# Patient Record
Sex: Female | Born: 1980 | Hispanic: No | Marital: Married | Smoking: Never smoker
Health system: Southern US, Community
[De-identification: ages and names within clinical notes are randomized; demographics above are authoritative.]

## PROBLEM LIST (undated history)

## (undated) DIAGNOSIS — D649 Anemia, unspecified: Secondary | ICD-10-CM

## (undated) HISTORY — DX: Anemia, unspecified: D64.9

---

## 2006-04-24 DIAGNOSIS — E039 Hypothyroidism, unspecified: Secondary | ICD-10-CM

## 2006-04-24 HISTORY — DX: Hypothyroidism, unspecified: E03.9

## 2020-04-24 NOTE — L&D Delivery Note (Signed)
Patient was C/C/+1 and pushed for 10 minutes with epidural.    NSVD  female infant, Apgars 8,9, weight P.   The patient had midline second degree perineal laceration repaired with 2-0 vicryl. Fundus was firm. EBL was expected amount. Placenta was delivered intact. Vagina was clear.  Delayed cord clamping done for 30-60 seconds while warming baby. Baby was vigorous and doing skin to skin with mother.  Jessica Foley

## 2020-05-17 ENCOUNTER — Other Ambulatory Visit: Payer: Self-pay | Admitting: Hematology and Oncology

## 2020-05-17 MED ORDER — AMOXICILLIN-POT CLAVULANATE 875-125 MG PO TABS: 1.0000 | ORAL_TABLET | Freq: Two times a day (BID) | ORAL | 0 refills | Status: DC

## 2020-05-17 MED FILL — AMOX TR-K CLV 875-125 MG TA: 875-125 | 14 days supply | Qty: 28 | Fill #0

## 2020-07-12 DIAGNOSIS — Z3201 Encounter for pregnancy test, result positive: Secondary | ICD-10-CM | POA: Diagnosis not present

## 2020-07-12 DIAGNOSIS — N925 Other specified irregular menstruation: Secondary | ICD-10-CM | POA: Diagnosis not present

## 2020-07-12 DIAGNOSIS — Z124 Encounter for screening for malignant neoplasm of cervix: Secondary | ICD-10-CM | POA: Diagnosis not present

## 2020-07-12 DIAGNOSIS — Z3480 Encounter for supervision of other normal pregnancy, unspecified trimester: Secondary | ICD-10-CM | POA: Diagnosis not present

## 2020-07-12 DIAGNOSIS — Z113 Encounter for screening for infections with a predominantly sexual mode of transmission: Secondary | ICD-10-CM | POA: Diagnosis not present

## 2020-07-14 ENCOUNTER — Other Ambulatory Visit (HOSPITAL_COMMUNITY): Payer: Self-pay | Admitting: Obstetrics

## 2020-07-14 MED FILL — AMOXICILLIN 500 MG CAPSULE: 500 | 7 days supply | Qty: 21 | Fill #0

## 2020-08-04 DIAGNOSIS — Z3480 Encounter for supervision of other normal pregnancy, unspecified trimester: Secondary | ICD-10-CM | POA: Diagnosis not present

## 2020-08-04 DIAGNOSIS — Z369 Encounter for antenatal screening, unspecified: Secondary | ICD-10-CM | POA: Diagnosis not present

## 2020-08-04 DIAGNOSIS — E02 Subclinical iodine-deficiency hypothyroidism: Secondary | ICD-10-CM | POA: Diagnosis not present

## 2020-08-04 DIAGNOSIS — O09511 Supervision of elderly primigravida, first trimester: Secondary | ICD-10-CM | POA: Diagnosis not present

## 2020-08-04 LAB — OB RESULTS CONSOLE HEPATITIS B SURFACE ANTIGEN: Hepatitis B Surface Ag: NEGATIVE

## 2020-08-04 LAB — OB RESULTS CONSOLE GC/CHLAMYDIA
Chlamydia: NEGATIVE
Gonorrhea: NEGATIVE

## 2020-08-04 LAB — OB RESULTS CONSOLE HIV ANTIBODY (ROUTINE TESTING): HIV: NONREACTIVE

## 2020-08-04 LAB — OB RESULTS CONSOLE ABO/RH: RH Type: POSITIVE

## 2020-08-04 LAB — OB RESULTS CONSOLE RPR: RPR: NONREACTIVE

## 2020-08-04 LAB — OB RESULTS CONSOLE ANTIBODY SCREEN: Antibody Screen: NEGATIVE

## 2020-08-04 LAB — OB RESULTS CONSOLE RUBELLA ANTIBODY, IGM: Rubella: IMMUNE

## 2020-08-24 ENCOUNTER — Other Ambulatory Visit: Payer: Self-pay | Admitting: Obstetrics

## 2020-08-24 DIAGNOSIS — Z363 Encounter for antenatal screening for malformations: Secondary | ICD-10-CM

## 2020-08-24 DIAGNOSIS — O09522 Supervision of elderly multigravida, second trimester: Secondary | ICD-10-CM

## 2020-08-24 DIAGNOSIS — Z3A19 19 weeks gestation of pregnancy: Secondary | ICD-10-CM

## 2020-08-27 ENCOUNTER — Other Ambulatory Visit: Payer: Self-pay

## 2020-09-01 DIAGNOSIS — Z369 Encounter for antenatal screening, unspecified: Secondary | ICD-10-CM | POA: Diagnosis not present

## 2020-09-13 ENCOUNTER — Ambulatory Visit (INDEPENDENT_AMBULATORY_CARE_PROVIDER_SITE_OTHER): Payer: No Typology Code available for payment source | Admitting: Allergy

## 2020-09-13 ENCOUNTER — Other Ambulatory Visit: Payer: Self-pay

## 2020-09-13 ENCOUNTER — Encounter: Payer: Self-pay | Admitting: Allergy

## 2020-09-13 ENCOUNTER — Other Ambulatory Visit (HOSPITAL_COMMUNITY): Payer: Self-pay

## 2020-09-13 VITALS — BP 98/58 | HR 78 | Temp 98.7°F | Resp 20 | Ht 62.0 in | Wt 143.2 lb

## 2020-09-13 DIAGNOSIS — H1013 Acute atopic conjunctivitis, bilateral: Secondary | ICD-10-CM | POA: Diagnosis not present

## 2020-09-13 DIAGNOSIS — Z3A18 18 weeks gestation of pregnancy: Secondary | ICD-10-CM

## 2020-09-13 DIAGNOSIS — J3089 Other allergic rhinitis: Secondary | ICD-10-CM | POA: Diagnosis not present

## 2020-09-13 MED ORDER — AZELASTINE HCL 0.1 % NA SOLN
1.0000 | Freq: Two times a day (BID) | NASAL | 5 refills | Status: DC | PRN
  Filled 2020-09-13: qty 30, 30d supply, fill #0
  Filled 2020-09-27: qty 30, 34d supply, fill #0

## 2020-09-13 MED ORDER — BUDESONIDE 32 MCG/ACT NA SUSP
1.0000 | Freq: Two times a day (BID) | NASAL | 5 refills | Status: DC
  Filled 2020-09-13 – 2020-09-27 (×2): qty 8.43, 30d supply, fill #0

## 2020-09-13 NOTE — Assessment & Plan Note (Signed)
.   See assessment and plan as above. . Declines eyedrops. 

## 2020-09-13 NOTE — Progress Notes (Signed)
New Patient Note  RE: Jessica Foley MRN: [id number] DOB: 17-Jan-1981 Date of Office Visit: 09/13/2020  Consult requested by: No ref. provider found Primary care provider: Pcp, No  Chief Complaint: Allergic Rhinitis  (New to [place] and had congestion, post nasal drip, sleepless nights. Zyrtec, afrin, Flonase, sudafed. )  History of Present Illness: I had the pleasure of seeing Jessica Foley for initial evaluation at the [facility] on 09/13/2020. She is a 40 y.o. female, who is self-referred here for the evaluation of allergic rhinitis.  She reports symptoms of nasal congestion, PND, rhinorrhea, sore throat, sneezing, itchy/watery eyes. Symptoms have been going on for a few months since moving to [place] 8 months ago. Noticed it in March and slowly improving now. Some fall time symptoms in [place]. Other triggers include exposure to outdoors. Anosmia: no. Headache: sometimes. She has used sudafed, zyrtec, afrin, Flonase with minimal improvement in symptoms. Sinus infections: one. Previous work up includes: none. Previous ENT evaluation: no. Previous sinus imaging: no. History of nasal polyps: no. Last eye exam: not recently. History of reflux: not daily.  Currently [redacted] weeks pregnant - third pregnancy, no allergy flare with prior pregnancies.   Assessment and Plan: Jessica Foley is a 40 y.o. female with: Other allergic rhinitis Rhinoconjunctivitis symptoms mainly in the spring since moved to [place] 8 months ago.  Tried Zyrtec, Sudafed, Afrin and Flonase with minimal benefit.  Currently [redacted] weeks pregnant.  No prior allergy/ENT evaluation. . Get bloodwork as below.   Use Rhinocort nasal spray 1-2 sprays per nostril twice a day as needed for nasal congestion.   Use azelastine nasal spray 1-2 sprays per nostril twice a day as needed for runny nose/drainage.  Wean off sudafed and Afrin.  Nasal saline spray (i.e., Simply Saline) or nasal saline lavage (i.e., NeilMed) is  recommended as needed and prior to medicated nasal sprays.  Use over the counter antihistamines such as Zyrtec (cetirizine), Claritin (loratadine), Allegra (fexofenadine), or Xyzal (levocetirizine) daily as needed. May switch antihistamines every few months.  See below for environmental control measures.   Consider allergy injections for long term control if above medications do not help the symptoms - handout given depending on bloodwork results. This can be started after pregnancy is complete.   Allergic conjunctivitis of both eyes  See assessment and plan as above. Declines eye drops.   Return in about 6 months (around 03/16/2021).  Meds ordered this encounter  Medications  . azelastine (ASTELIN) 0.1 % nasal spray    Sig: Place 1-2 sprays into both nostrils 2 (two) times daily as needed. Use in each nostril as directed    Dispense:  30 mL    Refill:  5  . budesonide (RHINOCORT AQUA) 32 MCG/ACT nasal spray    Sig: Place 1-2 sprays into both nostrils in the morning and at bedtime.    Dispense:  5 mL    Refill:  5    Lab Orders     Allergens w/Total IgE Area 2  Other allergy screening: Asthma: no Food allergy: no Medication allergy: no Hymenoptera allergy: no Urticaria: no Eczema:no History of recurrent infections suggestive of immunodeficency: no  Diagnostics: None.  Past Medical History: Patient Active Problem List   Diagnosis Date Noted  . Other allergic rhinitis 09/13/2020  . Allergic conjunctivitis of both eyes 09/13/2020   History reviewed. No pertinent past medical history. Past Surgical History: History reviewed. No pertinent surgical history. Medication List:  Current Outpatient Medications  Medication  Sig Dispense Refill  . azelastine (ASTELIN) 0.1 % nasal spray Place 1-2 sprays into both nostrils 2 (two) times daily as needed. Use in each nostril as directed 30 mL 5  . budesonide (RHINOCORT AQUA) 32 MCG/ACT nasal spray Place 1-2 sprays into both  nostrils in the morning and at bedtime. 5 mL 5  . cetirizine (ZYRTEC) 10 MG tablet Take 10 mg by mouth daily.    Marland Kitchen oxymetazoline (AFRIN) 0.05 % nasal spray Place 1 spray into both nostrils 2 (two) times daily.    . Prenatal Multivit-Min-Fe-FA (PRE-NATAL PO) Take by mouth.     No current facility-administered medications for this visit.   Allergies: Not on File Social History: Social History   Socioeconomic History  . Marital status: Married    Spouse name: Not on file  . Number of children: Not on file  . Years of education: Not on file  . Highest education level: Not on file  Occupational History  . Not on file  Tobacco Use  . Smoking status: Never Smoker  . Smokeless tobacco: Never Used  Substance and Sexual Activity  . Alcohol use: Never  . Drug use: Never  . Sexual activity: Yes  Other Topics Concern  . Not on file  Social History Narrative  . Not on file   Social Determinants of Health   Financial Resource Strain: Not on file  Food Insecurity: Not on file  Transportation Needs: Not on file  Physical Activity: Not on file  Stress: Not on file  Social Connections: Not on file   Lives in a house which is less than 2 years older. Smoking: denies Occupation: physician  Environmental HistorySurveyor, minerals in the house: no Carpet in the family room: no Carpet in the bedroom: yes Heating: gas Cooling: central Pet: yes 1 dog x 2 yrs  Family History: Family History  Problem Relation Age of Onset  . Allergic rhinitis Brother    Review of Systems  Constitutional: Negative for appetite change, chills, fever and unexpected weight change.  HENT: Positive for congestion, postnasal drip, rhinorrhea, sneezing and sore throat.   Eyes: Positive for itching.  Respiratory: Negative for cough, chest tightness, shortness of breath and wheezing.   Cardiovascular: Negative for chest pain.  Gastrointestinal: Negative for abdominal pain.  Genitourinary: Negative for  difficulty urinating.  Skin: Negative for rash.  Neurological: Positive for headaches.   Objective: BP (!) 98/58   Pulse 78   Temp 98.7 F (37.1 C)   Resp 20   Ht 5\' 2"  (1.575 m)   Wt 143 lb 3.2 oz (65 kg)   SpO2 99%   BMI 26.19 kg/m  Body mass index is 26.19 kg/m. Physical Exam Vitals and nursing note reviewed.  Constitutional:      Appearance: Normal appearance. She is well-developed.  HENT:     Head: Normocephalic and atraumatic.     Right Ear: Tympanic membrane and external ear normal.     Left Ear: Tympanic membrane and external ear normal.     Nose: Congestion (on right side) present.     Mouth/Throat:     Mouth: Mucous membranes are moist.     Pharynx: Oropharynx is clear.  Eyes:     Conjunctiva/sclera: Conjunctivae normal.  Cardiovascular:     Rate and Rhythm: Normal rate and regular rhythm.     Heart sounds: Normal heart sounds. No murmur heard. No friction rub. No gallop.   Pulmonary:     Effort: Pulmonary effort is normal.  Breath sounds: Normal breath sounds. No wheezing, rhonchi or rales.  Abdominal:     Palpations: Abdomen is soft.  Musculoskeletal:     Cervical back: Neck supple.  Skin:    General: Skin is warm.     Findings: No rash.  Neurological:     Mental Status: She is alert and oriented to person, place, and time.  Psychiatric:        Behavior: Behavior normal.    The plan was reviewed with the patient/family, and all questions/concerned were addressed.  It was my pleasure to see Jessica Foley today and participate in her care. Please feel free to contact me with any questions or concerns.  Sincerely,  Wyline Mood, DO Allergy & Immunology  [facility] office: [phone] [facility] office: [phone]

## 2020-09-13 NOTE — Assessment & Plan Note (Signed)
Rhinoconjunctivitis symptoms mainly in the spring since moved to [place] 8 months ago.  Tried Zyrtec, Sudafed, Afrin and Flonase with minimal benefit.  Currently [redacted] weeks pregnant.  No prior allergy/ENT evaluation. . Get bloodwork as below.   Use Rhinocort nasal spray 1-2 sprays per nostril twice a day as needed for nasal congestion.   Use azelastine nasal spray 1-2 sprays per nostril twice a day as needed for runny nose/drainage.  Wean off sudafed and Afrin.  Nasal saline spray (i.e., Simply Saline) or nasal saline lavage (i.e., NeilMed) is recommended as needed and prior to medicated nasal sprays.  Use over the counter antihistamines such as Zyrtec (cetirizine), Claritin (loratadine), Allegra (fexofenadine), or Xyzal (levocetirizine) daily as needed. May switch antihistamines every few months.  See below for environmental control measures.   Consider allergy injections for long term control if above medications do not help the symptoms - handout given depending on bloodwork results. This can be started after pregnancy is complete.

## 2020-09-13 NOTE — Patient Instructions (Addendum)
Allergies:  Marland Kitchen Get bloodwork:  o We are ordering labs, so please allow 1-2 weeks for the results to come back. o With the newly implemented Cures Act, the labs might be visible to you at the same time that they become visible to me. However, I will not address the results until all of the results are back, so please be patient.    Use Rhinocort nasal spray 1-2 sprays per nostril twice a day as needed for nasal congestion.   Use azelastine nasal spray 1-2 sprays per nostril twice a day as needed for runny nose/drainage.  Wean off sudafed and Afrin.  Nasal saline spray (i.e., Simply Saline) or nasal saline lavage (i.e., NeilMed) is recommended as needed and prior to medicated nasal sprays.  Use over the counter antihistamines such as Zyrtec (cetirizine), Claritin (loratadine), Allegra (fexofenadine), or Xyzal (levocetirizine) daily as needed. May switch antihistamines every few months.  See below for environmental control measures.    Consider allergy injections for long term control if above medications do not help the symptoms - handout given.   Follow up in 6 months or sooner if needed.   Reducing Pollen Exposure . Pollen seasons: trees (spring), grass (summer) and ragweed/weeds (fall). Marland Kitchen Keep windows closed in your home and car to lower pollen exposure.  Lilian Kapur air conditioning in the bedroom and throughout the house if possible.  . Avoid going out in dry windy days - especially early morning. . Pollen counts are highest between 5 - 10 AM and on dry, hot and windy days.  . Save outside activities for late afternoon or after a heavy rain, when pollen levels are lower.  . Avoid mowing of grass if you have grass pollen allergy. Marland Kitchen Be aware that pollen can also be transported indoors on people and pets.  . Dry your clothes in an automatic dryer rather than hanging them outside where they might collect pollen.  . Rinse hair and eyes before bedtime.  Pet Allergen  Avoidance: . Contrary to popular opinion, there are no "hypoallergenic" breeds of dogs or cats. That is because people are not allergic to an animal's hair, but to an allergen found in the animal's saliva, dander (dead skin flakes) or urine. Pet allergy symptoms typically occur within minutes. For some people, symptoms can build up and become most severe 8 to 12 hours after contact with the animal. People with severe allergies can experience reactions in public places if dander has been transported on the pet owners' clothing. Marland Kitchen Keeping an animal outdoors is only a partial solution, since homes with pets in the yard still have higher concentrations of animal allergens. . Before getting a pet, ask your allergist to determine if you are allergic to animals. If your pet is already considered part of your family, try to minimize contact and keep the pet out of the bedroom and other rooms where you spend a great deal of time. . As with dust mites, vacuum carpets often or replace carpet with a hardwood floor, tile or linoleum. . High-efficiency particulate air (HEPA) cleaners can reduce allergen levels over time. . While dander and saliva are the source of cat and dog allergens, urine is the source of allergens from rabbits, hamsters, mice and [place] pigs; so ask a non-allergic family member to clean the animal's cage. . If you have a pet allergy, talk to your allergist about the potential for allergy immunotherapy (allergy shots). This strategy can often provide long-term relief.

## 2020-09-14 ENCOUNTER — Other Ambulatory Visit (HOSPITAL_COMMUNITY): Payer: Self-pay

## 2020-09-19 LAB — ALLERGENS W/TOTAL IGE AREA 2
Alternaria Alternata IgE: <0.1 kU/L
Aspergillus Fumigatus IgE: <0.1 kU/L
Bermuda Grass IgE: 0.14 kU/L — AB
Cat Dander IgE: 0.39 kU/L — AB
Cedar, Mountain IgE: 8.72 kU/L — AB
Cladosporium Herbarum IgE: <0.1 kU/L
Cockroach, German IgE: 0.23 kU/L — AB
Common Silver Birch IgE: <0.1 kU/L
Cottonwood IgE: <0.1 kU/L
D Farinae IgE: 0.62 kU/L — AB
D Pteronyssinus IgE: 0.35 kU/L — AB
Dog Dander IgE: 1.85 kU/L — AB
Elm, American IgE: <0.1 kU/L
IgE (Immunoglobulin E), Serum: 78 IU/mL (ref 6–495)
Johnson Grass IgE: <0.1 kU/L
Maple/Box Elder IgE: <0.1 kU/L
Mouse Urine IgE: <0.1 kU/L
Oak, White IgE: <0.1 kU/L
Pecan, Hickory IgE: <0.1 kU/L
Penicillium Chrysogen IgE: <0.1 kU/L
Pigweed, Rough IgE: <0.1 kU/L
Ragweed, Short IgE: 0.13 kU/L — AB
Sheep Sorrel IgE Qn: <0.1 kU/L
Timothy Grass IgE: <0.1 kU/L
White Mulberry IgE: <0.1 kU/L

## 2020-09-22 ENCOUNTER — Other Ambulatory Visit (HOSPITAL_COMMUNITY): Payer: Self-pay

## 2020-09-27 ENCOUNTER — Other Ambulatory Visit (HOSPITAL_COMMUNITY): Payer: Self-pay

## 2020-09-27 ENCOUNTER — Encounter: Payer: Self-pay | Admitting: *Deleted

## 2020-09-29 ENCOUNTER — Ambulatory Visit: Payer: No Typology Code available for payment source | Attending: Obstetrics

## 2020-09-29 ENCOUNTER — Other Ambulatory Visit: Payer: Self-pay

## 2020-09-29 ENCOUNTER — Ambulatory Visit: Payer: No Typology Code available for payment source | Admitting: *Deleted

## 2020-09-29 VITALS — BP 115/68 | HR 84

## 2020-09-29 DIAGNOSIS — Z363 Encounter for antenatal screening for malformations: Secondary | ICD-10-CM | POA: Diagnosis not present

## 2020-09-29 DIAGNOSIS — O09522 Supervision of elderly multigravida, second trimester: Secondary | ICD-10-CM

## 2020-09-29 DIAGNOSIS — Z3A19 19 weeks gestation of pregnancy: Secondary | ICD-10-CM | POA: Insufficient documentation

## 2020-09-30 ENCOUNTER — Other Ambulatory Visit: Payer: Self-pay | Admitting: *Deleted

## 2020-09-30 DIAGNOSIS — O43192 Other malformation of placenta, second trimester: Secondary | ICD-10-CM

## 2020-10-05 ENCOUNTER — Other Ambulatory Visit (HOSPITAL_COMMUNITY): Payer: Self-pay

## 2020-10-28 ENCOUNTER — Ambulatory Visit: Payer: No Typology Code available for payment source | Attending: Obstetrics and Gynecology

## 2020-10-28 ENCOUNTER — Ambulatory Visit: Payer: No Typology Code available for payment source | Admitting: *Deleted

## 2020-10-28 ENCOUNTER — Other Ambulatory Visit: Payer: Self-pay

## 2020-10-28 ENCOUNTER — Encounter: Payer: Self-pay | Admitting: *Deleted

## 2020-10-28 ENCOUNTER — Other Ambulatory Visit: Payer: Self-pay | Admitting: Obstetrics and Gynecology

## 2020-10-28 VITALS — BP 111/53 | HR 84

## 2020-10-28 DIAGNOSIS — O43192 Other malformation of placenta, second trimester: Secondary | ICD-10-CM | POA: Insufficient documentation

## 2020-10-28 DIAGNOSIS — O09523 Supervision of elderly multigravida, third trimester: Secondary | ICD-10-CM

## 2020-10-28 DIAGNOSIS — O09522 Supervision of elderly multigravida, second trimester: Secondary | ICD-10-CM | POA: Insufficient documentation

## 2020-10-28 DIAGNOSIS — Z3A23 23 weeks gestation of pregnancy: Secondary | ICD-10-CM | POA: Diagnosis not present

## 2020-10-29 ENCOUNTER — Other Ambulatory Visit: Payer: Self-pay | Admitting: Obstetrics and Gynecology

## 2020-10-29 DIAGNOSIS — O43192 Other malformation of placenta, second trimester: Secondary | ICD-10-CM

## 2020-10-29 DIAGNOSIS — O09522 Supervision of elderly multigravida, second trimester: Secondary | ICD-10-CM

## 2020-11-26 ENCOUNTER — Ambulatory Visit: Payer: No Typology Code available for payment source | Attending: Obstetrics and Gynecology

## 2020-11-26 ENCOUNTER — Other Ambulatory Visit: Payer: Self-pay

## 2020-11-26 ENCOUNTER — Other Ambulatory Visit: Payer: Self-pay | Admitting: *Deleted

## 2020-11-26 ENCOUNTER — Ambulatory Visit: Payer: No Typology Code available for payment source | Admitting: *Deleted

## 2020-11-26 ENCOUNTER — Encounter: Payer: Self-pay | Admitting: *Deleted

## 2020-11-26 VITALS — BP 110/61 | HR 84

## 2020-11-26 DIAGNOSIS — O09522 Supervision of elderly multigravida, second trimester: Secondary | ICD-10-CM | POA: Diagnosis not present

## 2020-11-26 DIAGNOSIS — O43192 Other malformation of placenta, second trimester: Secondary | ICD-10-CM | POA: Diagnosis not present

## 2020-11-26 DIAGNOSIS — O43193 Other malformation of placenta, third trimester: Secondary | ICD-10-CM

## 2020-12-28 ENCOUNTER — Ambulatory Visit: Payer: No Typology Code available for payment source | Attending: Obstetrics and Gynecology

## 2020-12-28 ENCOUNTER — Other Ambulatory Visit: Payer: Self-pay | Admitting: *Deleted

## 2020-12-28 ENCOUNTER — Other Ambulatory Visit: Payer: Self-pay

## 2020-12-28 ENCOUNTER — Ambulatory Visit: Payer: No Typology Code available for payment source | Admitting: *Deleted

## 2020-12-28 ENCOUNTER — Encounter: Payer: Self-pay | Admitting: *Deleted

## 2020-12-28 VITALS — BP 115/59 | HR 85

## 2020-12-28 DIAGNOSIS — Z362 Encounter for other antenatal screening follow-up: Secondary | ICD-10-CM | POA: Diagnosis not present

## 2020-12-28 DIAGNOSIS — Z3A31 31 weeks gestation of pregnancy: Secondary | ICD-10-CM | POA: Diagnosis not present

## 2020-12-28 DIAGNOSIS — O43193 Other malformation of placenta, third trimester: Secondary | ICD-10-CM | POA: Insufficient documentation

## 2020-12-28 DIAGNOSIS — O09523 Supervision of elderly multigravida, third trimester: Secondary | ICD-10-CM | POA: Insufficient documentation

## 2021-01-25 ENCOUNTER — Encounter: Payer: Self-pay | Admitting: *Deleted

## 2021-01-25 ENCOUNTER — Ambulatory Visit: Payer: No Typology Code available for payment source | Attending: Obstetrics and Gynecology

## 2021-01-25 ENCOUNTER — Other Ambulatory Visit: Payer: Self-pay

## 2021-01-25 ENCOUNTER — Ambulatory Visit: Payer: No Typology Code available for payment source | Admitting: *Deleted

## 2021-01-25 VITALS — BP 108/55 | HR 82

## 2021-01-25 DIAGNOSIS — O43193 Other malformation of placenta, third trimester: Secondary | ICD-10-CM | POA: Insufficient documentation

## 2021-01-25 DIAGNOSIS — Z3A35 35 weeks gestation of pregnancy: Secondary | ICD-10-CM

## 2021-01-25 DIAGNOSIS — O09523 Supervision of elderly multigravida, third trimester: Secondary | ICD-10-CM | POA: Diagnosis present

## 2021-02-01 LAB — OB RESULTS CONSOLE GBS
GBS: NEGATIVE
GBS: NEGATIVE
GBS: NEGATIVE

## 2021-02-12 ENCOUNTER — Other Ambulatory Visit: Payer: Self-pay

## 2021-02-12 ENCOUNTER — Inpatient Hospital Stay (HOSPITAL_COMMUNITY)
Admission: AD | Admit: 2021-02-12 | Discharge: 2021-02-13 | Disposition: A | Discharge status: Home / Self Care | Payer: No Typology Code available for payment source | Attending: Obstetrics and Gynecology | Admitting: Obstetrics and Gynecology

## 2021-02-12 DIAGNOSIS — O43193 Other malformation of placenta, third trimester: Secondary | ICD-10-CM | POA: Insufficient documentation

## 2021-02-12 DIAGNOSIS — O368130 Decreased fetal movements, third trimester, not applicable or unspecified: Secondary | ICD-10-CM | POA: Insufficient documentation

## 2021-02-12 DIAGNOSIS — Z3A38 38 weeks gestation of pregnancy: Secondary | ICD-10-CM | POA: Insufficient documentation

## 2021-02-12 DIAGNOSIS — Z711 Person with feared health complaint in whom no diagnosis is made: Secondary | ICD-10-CM

## 2021-02-12 DIAGNOSIS — Z3689 Encounter for other specified antenatal screening: Secondary | ICD-10-CM

## 2021-02-12 DIAGNOSIS — O368190 Decreased fetal movements, unspecified trimester, not applicable or unspecified: Secondary | ICD-10-CM

## 2021-02-12 NOTE — MAU Note (Signed)
Pt stated she felt a lot of jerky movement form baby about 45 min ago. Movement where painful and pt is concerned because there is marginal cor incretion. Felt like the movement could have bee a seizure. Reports she is having some cts but not regular. C/o some back pain. Denies any vag bleeding or leaking.

## 2021-02-13 ENCOUNTER — Inpatient Hospital Stay (HOSPITAL_BASED_OUTPATIENT_CLINIC_OR_DEPARTMENT_OTHER): Payer: No Typology Code available for payment source

## 2021-02-13 ENCOUNTER — Inpatient Hospital Stay: Payer: No Typology Code available for payment source

## 2021-02-13 ENCOUNTER — Encounter (HOSPITAL_COMMUNITY): Payer: Self-pay | Admitting: Obstetrics and Gynecology

## 2021-02-13 DIAGNOSIS — O43123 Velamentous insertion of umbilical cord, third trimester: Secondary | ICD-10-CM | POA: Diagnosis not present

## 2021-02-13 DIAGNOSIS — Z3A38 38 weeks gestation of pregnancy: Secondary | ICD-10-CM | POA: Diagnosis not present

## 2021-02-13 DIAGNOSIS — O368130 Decreased fetal movements, third trimester, not applicable or unspecified: Secondary | ICD-10-CM

## 2021-02-13 DIAGNOSIS — O09523 Supervision of elderly multigravida, third trimester: Secondary | ICD-10-CM

## 2021-02-13 DIAGNOSIS — O43193 Other malformation of placenta, third trimester: Secondary | ICD-10-CM

## 2021-02-13 LAB — URINALYSIS, ROUTINE W REFLEX MICROSCOPIC
Bilirubin Urine: NEGATIVE
Glucose, UA: NEGATIVE mg/dL
Hgb urine dipstick: NEGATIVE
Ketones, ur: NEGATIVE mg/dL
Nitrite: NEGATIVE
Protein, ur: NEGATIVE mg/dL
Specific Gravity, Urine: 1.01 (ref 1.005–1.030)
pH: 7 (ref 5.0–8.0)

## 2021-02-13 NOTE — MAU Provider Note (Signed)
History     CSN: [id number]  Arrival date and time: 02/12/21 2337   Event Date/Time   First Provider Initiated Contact with Patient 02/13/21 0029      Chief Complaint  Patient presents with   concern for baby movement\   Jessica Foley is a 40 y.o. G3P2002 at [redacted]w[redacted]d who receives care at [facility].  She presents today for concern for baby movement.  She states at about 2145 she had rapid fetal movement.  She states movement has been intermittent since that time, but she was worried because she has a marginal cord insertion.  Patient reports some "intermittent' contractions and denies vaginal bleeding or leaking.  She denies problems during the pregnancy.  Husband is at bedside also contributing to HPI.    OB History     Gravida  3   Para  2   Term  2   Preterm      AB      Living  2      SAB      IAB      Ectopic      Multiple      Live Births  2           Past Medical History:  Diagnosis Date   Anemia     History reviewed. No pertinent surgical history.  Family History  Problem Relation Age of Onset   Allergic rhinitis Brother     Social History   Tobacco Use   Smoking status: Never   Smokeless tobacco: Never  Vaping Use   Vaping Use: Never used  Substance Use Topics   Alcohol use: Never   Drug use: Never    Allergies: No Known Allergies  Medications Prior to Admission  Medication Sig Dispense Refill Last Dose   Prenatal Multivit-Min-Fe-FA (PRE-NATAL PO) Take by mouth.   02/13/2021   amoxicillin (AMOXIL) 500 MG capsule TAKE 1 CAPSULE BY MOUTH EVERY 8 HOURS FOR 7 DAYS (Patient not taking: Reported on 09/29/2020) 21 capsule 0    amoxicillin-clavulanate (AUGMENTIN) 875-125 MG tablet TAKE 1 TABLET BY MOUTH TWICE DAILY FOR 14 DAYS. (Patient not taking: Reported on 09/29/2020) 28 tablet 0    azelastine (ASTELIN) 0.1 % nasal spray Place 1-2 sprays into both nostrils 2 (two) times daily as needed. Use in each nostril as directed  (Patient not taking: Reported on 09/29/2020) 30 mL 5    budesonide (RHINOCORT AQUA) 32 MCG/ACT nasal spray Place 1-2 sprays into both nostrils in the morning and at bedtime. (Patient not taking: Reported on 09/29/2020) 5 mL 5    cetirizine (ZYRTEC) 10 MG tablet Take 10 mg by mouth daily. (Patient not taking: Reported on 10/28/2020)      oxymetazoline (AFRIN) 0.05 % nasal spray Place 1 spray into both nostrils 2 (two) times daily. (Patient not taking: Reported on 10/28/2020)       Review of Systems  Gastrointestinal:  Positive for abdominal pain (Contractions). Negative for nausea and vomiting.  Genitourinary:  Negative for difficulty urinating, dysuria, vaginal bleeding and vaginal discharge.  Neurological:  Negative for dizziness, light-headedness and headaches.  Physical Exam   Blood pressure 119/60, pulse 88, temperature 97.8 F (36.6 C), resp. rate 18, height 5\' 2"  (1.575 m), weight 81.6 kg.  Physical Exam Vitals reviewed.  Constitutional:      Appearance: Normal appearance.  HENT:     Head: Normocephalic and atraumatic.  Eyes:     Conjunctiva/sclera: Conjunctivae normal.  Cardiovascular:  Rate and Rhythm: Normal rate.  Pulmonary:     Effort: Pulmonary effort is normal. No respiratory distress.  Abdominal:     Comments: Gravid  Musculoskeletal:        General: Normal range of motion.     Cervical back: Normal range of motion.  Skin:    General: Skin is warm and dry.  Neurological:     Mental Status: She is alert and oriented to person, place, and time.  Psychiatric:        Mood and Affect: Mood normal.        Behavior: Behavior normal.        Thought Content: Thought content normal.    Fetal Assessment 125 bpm, Mod Var, -Decels, +Accels Toco: Irregular  MAU Course  No results found for this or any previous visit (from the past 24 hour(s)). No results found.  MDM PE Labs: None EFM BPP  Assessment and Plan  40 year old G3P2002  SIUP at 38.4 weeks Cat I  FT Concern with FM  -PNR from [facility] Reviewed. -US shows marginal cord insertion. -Exam performed -Discussed reactive NST -Patient reports some discomfort with contractions. -Nurse to perform cervical check -Will send for BPP for further reassurance.   Cherre Robins MSN, CNM 02/13/2021, 12:29 AM   Reassessment (2:11 AM)  -BPP returns at 8/8. -Patient and husband without questions. -Encouraged to call primary office or return to MAU if symptoms worsen or with the onset of new symptoms. -Discharged to home in stable condition.  Cherre Robins MSN, CNM Advanced Practice Provider, [facility] [employer]

## 2021-02-15 ENCOUNTER — Encounter (HOSPITAL_COMMUNITY): Payer: Self-pay | Admitting: *Deleted

## 2021-02-15 ENCOUNTER — Telehealth (HOSPITAL_COMMUNITY): Payer: Self-pay | Admitting: *Deleted

## 2021-02-15 NOTE — Telephone Encounter (Signed)
Preadmission screen  

## 2021-02-16 ENCOUNTER — Inpatient Hospital Stay (HOSPITAL_COMMUNITY): Payer: No Typology Code available for payment source

## 2021-02-16 ENCOUNTER — Encounter (HOSPITAL_COMMUNITY): Payer: Self-pay | Admitting: Obstetrics and Gynecology

## 2021-02-16 ENCOUNTER — Other Ambulatory Visit: Payer: Self-pay | Admitting: Obstetrics and Gynecology

## 2021-02-17 LAB — SARS CORONAVIRUS 2 (TAT 6-24 HRS): SARS Coronavirus 2: NEGATIVE

## 2021-02-18 ENCOUNTER — Inpatient Hospital Stay (HOSPITAL_COMMUNITY): Payer: No Typology Code available for payment source

## 2021-02-18 ENCOUNTER — Inpatient Hospital Stay (HOSPITAL_COMMUNITY)
Admission: AD | Admit: 2021-02-18 | Discharge: 2021-02-20 | DRG: 807 | Disposition: A | Discharge status: Home / Self Care | Payer: No Typology Code available for payment source | Attending: Obstetrics and Gynecology | Admitting: Obstetrics and Gynecology

## 2021-02-18 ENCOUNTER — Encounter (HOSPITAL_COMMUNITY): Payer: Self-pay | Admitting: Obstetrics and Gynecology

## 2021-02-18 ENCOUNTER — Inpatient Hospital Stay (HOSPITAL_COMMUNITY)
Admission: AD | Admit: 2021-02-18 | Payer: No Typology Code available for payment source | Source: Home / Self Care | Admitting: Obstetrics and Gynecology

## 2021-02-18 ENCOUNTER — Inpatient Hospital Stay (HOSPITAL_COMMUNITY): Payer: No Typology Code available for payment source | Admitting: Anesthesiology

## 2021-02-18 ENCOUNTER — Other Ambulatory Visit: Payer: Self-pay

## 2021-02-18 DIAGNOSIS — Z3490 Encounter for supervision of normal pregnancy, unspecified, unspecified trimester: Secondary | ICD-10-CM

## 2021-02-18 DIAGNOSIS — O26893 Other specified pregnancy related conditions, third trimester: Principal | ICD-10-CM | POA: Diagnosis present

## 2021-02-18 DIAGNOSIS — Z3493 Encounter for supervision of normal pregnancy, unspecified, third trimester: Secondary | ICD-10-CM

## 2021-02-18 DIAGNOSIS — Z3A39 39 weeks gestation of pregnancy: Secondary | ICD-10-CM | POA: Diagnosis not present

## 2021-02-18 LAB — CBC
HCT: 38.8 % (ref 36.0–46.0)
Hemoglobin: 13.1 g/dL (ref 12.0–15.0)
MCH: 29.3 pg (ref 26.0–34.0)
MCHC: 33.8 g/dL (ref 30.0–36.0)
MCV: 86.8 fL (ref 80.0–100.0)
Platelets: 174 10*3/uL (ref 150–400)
RBC: 4.47 MIL/uL (ref 3.87–5.11)
RDW: 14.1 % (ref 11.5–15.5)
WBC: 10.7 10*3/uL — ABNORMAL HIGH (ref 4.0–10.5)
nRBC: 0 % (ref 0.0–0.2)

## 2021-02-18 LAB — TYPE AND SCREEN
ABO/RH(D): O POS
Antibody Screen: NEGATIVE

## 2021-02-18 MED ORDER — FENTANYL-BUPIVACAINE-NACL 0.5-0.125-0.9 MG/250ML-% EP SOLN
12.0000 mL/h | EPIDURAL | Status: DC | PRN
  Administered 2021-02-18: 12 mL/h via EPIDURAL
  Filled 2021-02-18: qty 250

## 2021-02-18 MED ORDER — FENTANYL-BUPIVACAINE-NACL 0.5-0.125-0.9 MG/250ML-% EP SOLN: 12.0000 mL/h | EPIDURAL | Status: DC | PRN

## 2021-02-18 MED ORDER — LIDOCAINE HCL (PF) 1 % IJ SOLN
INTRAMUSCULAR | Status: DC | PRN
  Administered 2021-02-18: 10 mL via EPIDURAL

## 2021-02-18 MED ORDER — OXYCODONE-ACETAMINOPHEN 5-325 MG PO TABS: 1.0000 | ORAL_TABLET | ORAL | Status: DC | PRN

## 2021-02-18 MED ORDER — OXYTOCIN-SODIUM CHLORIDE 30-0.9 UT/500ML-% IV SOLN
INTRAVENOUS | Status: AC
  Filled 2021-02-18: qty 500

## 2021-02-18 MED ORDER — OXYTOCIN-SODIUM CHLORIDE 30-0.9 UT/500ML-% IV SOLN
1.0000 m[IU]/min | INTRAVENOUS | Status: DC
  Administered 2021-02-18: 2 m[IU]/min via INTRAVENOUS

## 2021-02-18 MED ORDER — LIDOCAINE HCL (PF) 1 % IJ SOLN: 30.0000 mL | INTRAMUSCULAR | Status: DC | PRN

## 2021-02-18 MED ORDER — ONDANSETRON HCL 4 MG/2ML IJ SOLN
4.0000 mg | Freq: Four times a day (QID) | INTRAMUSCULAR | Status: DC | PRN
  Administered 2021-02-18: 4 mg via INTRAVENOUS
  Filled 2021-02-18: qty 2

## 2021-02-18 MED ORDER — LACTATED RINGERS IV SOLN: 500.0000 mL | INTRAVENOUS | Status: DC | PRN

## 2021-02-18 MED ORDER — LACTATED RINGERS IV SOLN: INTRAVENOUS | Status: DC

## 2021-02-18 MED ORDER — DIPHENHYDRAMINE HCL 50 MG/ML IJ SOLN: 12.5000 mg | INTRAMUSCULAR | Status: DC | PRN

## 2021-02-18 MED ORDER — OXYCODONE-ACETAMINOPHEN 5-325 MG PO TABS: 2.0000 | ORAL_TABLET | ORAL | Status: DC | PRN

## 2021-02-18 MED ORDER — TERBUTALINE SULFATE 1 MG/ML IJ SOLN: 0.2500 mg | Freq: Once | INTRAMUSCULAR | Status: DC | PRN

## 2021-02-18 MED ORDER — OXYTOCIN BOLUS FROM INFUSION
333.0000 mL | Freq: Once | INTRAVENOUS | Status: AC
  Administered 2021-02-19: 333 mL via INTRAVENOUS

## 2021-02-18 MED ORDER — LACTATED RINGERS IV SOLN
500.0000 mL | Freq: Once | INTRAVENOUS | Status: AC
  Administered 2021-02-18: 500 mL via INTRAVENOUS

## 2021-02-18 MED ORDER — MISOPROSTOL 25 MCG QUARTER TABLET
25.0000 ug | ORAL_TABLET | ORAL | Status: DC | PRN
Start: 2021-02-18 — End: 2021-02-19

## 2021-02-18 MED ORDER — EPHEDRINE 5 MG/ML INJ: 10.0000 mg | INTRAVENOUS | Status: DC | PRN

## 2021-02-18 MED ORDER — ACETAMINOPHEN 325 MG PO TABS: 650.0000 mg | ORAL_TABLET | ORAL | Status: DC | PRN

## 2021-02-18 MED ORDER — PHENYLEPHRINE 40 MCG/ML (10ML) SYRINGE FOR IV PUSH (FOR BLOOD PRESSURE SUPPORT): 80.0000 ug | PREFILLED_SYRINGE | INTRAVENOUS | Status: DC | PRN

## 2021-02-18 MED ORDER — SOD CITRATE-CITRIC ACID 500-334 MG/5ML PO SOLN
30.0000 mL | ORAL | Status: DC | PRN
Start: 2021-02-18 — End: 2021-02-19

## 2021-02-18 MED ORDER — OXYTOCIN-SODIUM CHLORIDE 30-0.9 UT/500ML-% IV SOLN
2.5000 [IU]/h | INTRAVENOUS | Status: DC
  Administered 2021-02-19: 2.5 [IU]/h via INTRAVENOUS
  Filled 2021-02-18: qty 500

## 2021-02-18 MED ORDER — PHENYLEPHRINE 40 MCG/ML (10ML) SYRINGE FOR IV PUSH (FOR BLOOD PRESSURE SUPPORT)
80.0000 ug | PREFILLED_SYRINGE | INTRAVENOUS | Status: DC | PRN
  Filled 2021-02-18: qty 10

## 2021-02-18 NOTE — H&P (Signed)
40 y.o. [redacted]w[redacted]d  G3P2002 [facility] pt comes in for IOL for AMA at term.  Otherwise has good fetal movement and no bleeding.  Past Medical History:  Diagnosis Date   Anemia    Hypothyroidism 2008   subclinical hypothyroidism during pregnancy   No past surgical history on file.  OB History  Gravida Para Term Preterm AB Living  3 2 2     2   SAB IAB Ectopic Multiple Live Births          2    # Outcome Date GA Lbr Len/2nd Weight Sex Delivery Anes PTL Lv  3 Current           2 Term 04/24/10 [redacted]w[redacted]d  3771 g M Vag-Spont     1 Term 04/24/06 [redacted]w[redacted]d  3317 g F Vag-Spont       Social History   Socioeconomic History   Marital status: Married    Spouse name: Not on file   Number of children: Not on file   Years of education: Not on file   Highest education level: Not on file  Occupational History   Not on file  Tobacco Use   Smoking status: Never   Smokeless tobacco: Never  Vaping Use   Vaping Use: Never used  Substance and Sexual Activity   Alcohol use: Never   Drug use: Never   Sexual activity: Yes  Other Topics Concern   Not on file  Social History Narrative   ** Merged History Encounter **       Social Determinants of Health   Financial Resource Strain: Not on file  Food Insecurity: Not on file  Transportation Needs: Not on file  Physical Activity: Not on file  Stress: Not on file  Social Connections: Not on file  Intimate Partner Violence: Not on file   Patient has no known allergies.    Prenatal Transfer Tool  Maternal Diabetes: No Genetic Screening: Normal Maternal Ultrasounds/Referrals: Normal Fetal Ultrasounds or other Referrals:  None Maternal Substance Abuse:  No Significant Maternal Medications:  None Significant Maternal Lab Results: Group B Strep negative  Other PNC: uncomplicated.    There were no vitals filed for this visit.  Lungs/Cor:  NAD Abdomen:  soft, gravid Ex:  no cords, erythema SVE:  3/50/-2 FHTs:  120s, good STV, NST R; Cat 1  tracing. Toco:  q occ   A/P   AMA term induction.  GBS neg.  Pit and arom.  [redacted]w[redacted]d

## 2021-02-18 NOTE — Anesthesia Procedure Notes (Addendum)
Epidural Patient location during procedure: OB Start time: 02/18/2021 5:38 PM End time: 02/18/2021 5:42 PM  Staffing Anesthesiologist: Bethena Midget, MD  Preanesthetic Checklist Completed: patient identified, IV checked, site marked, risks and benefits discussed, surgical consent, monitors and equipment checked, pre-op evaluation and timeout performed  Epidural Patient position: sitting Prep: DuraPrep and site prepped and draped Patient monitoring: continuous pulse ox and blood pressure Approach: midline Location: L4-L5 Injection technique: LOR air  Needle:  Needle type: Tuohy  Needle gauge: 17 G Needle length: 9 cm and 9 Needle insertion depth: 4 cm Catheter type: closed end flexible Catheter size: 19 Gauge Catheter at skin depth: 9 cm Test dose: negative  Assessment Events: blood not aspirated, injection not painful, no injection resistance, no paresthesia and negative IV test

## 2021-02-18 NOTE — Anesthesia Preprocedure Evaluation (Signed)
Anesthesia Evaluation  Patient identified by MRN, date of birth, ID band Patient awake    Reviewed: Allergy & Precautions, H&P , NPO status , Patient's Chart, lab work & pertinent test results, reviewed documented beta blocker date and time   Airway Mallampati: I  TM Distance: >3 FB Neck ROM: full    Dental no notable dental hx. (+) Teeth Intact, Dental Advisory Given   Pulmonary neg pulmonary ROS,    Pulmonary exam normal breath sounds clear to auscultation       Cardiovascular negative cardio ROS Normal cardiovascular exam Rhythm:regular Rate:Normal     Neuro/Psych negative neurological ROS  negative psych ROS   GI/Hepatic negative GI ROS, Neg liver ROS,   Endo/Other  Hypothyroidism   Renal/GU negative Renal ROS  negative genitourinary   Musculoskeletal   Abdominal   Peds  Hematology  (+) Blood dyscrasia, anemia ,   Anesthesia Other Findings   Reproductive/Obstetrics (+) Pregnancy                             Anesthesia Physical Anesthesia Plan  ASA: 2  Anesthesia Plan: Epidural   Post-op Pain Management:    Induction:   PONV Risk Score and Plan: 2  Airway Management Planned: Natural Airway  Additional Equipment: None  Intra-op Plan:   Post-operative Plan:   Informed Consent: I have reviewed the patients History and Physical, chart, labs and discussed the procedure including the risks, benefits and alternatives for the proposed anesthesia with the patient or authorized representative who has indicated his/her understanding and acceptance.     Dental Advisory Given  Plan Discussed with: Anesthesiologist  Anesthesia Plan Comments: (Labs checked- platelets confirmed with RN in room. Fetal heart tracing, per RN, reported to be stable enough for sitting procedure. Discussed epidural, and patient consents to the procedure:  included risk of possible headache,backache, failed  block, allergic reaction, and nerve injury. This patient was asked if she had any questions or concerns before the procedure started.)        Anesthesia Quick Evaluation

## 2021-02-19 LAB — CBC
HCT: 34 % — ABNORMAL LOW (ref 36.0–46.0)
Hemoglobin: 11.4 g/dL — ABNORMAL LOW (ref 12.0–15.0)
MCH: 29.2 pg (ref 26.0–34.0)
MCHC: 33.5 g/dL (ref 30.0–36.0)
MCV: 87.2 fL (ref 80.0–100.0)
Platelets: 152 10*3/uL (ref 150–400)
RBC: 3.9 MIL/uL (ref 3.87–5.11)
RDW: 14.2 % (ref 11.5–15.5)
WBC: 14.7 10*3/uL — ABNORMAL HIGH (ref 4.0–10.5)
nRBC: 0 % (ref 0.0–0.2)

## 2021-02-19 LAB — RPR: RPR Ser Ql: NONREACTIVE

## 2021-02-19 MED ORDER — WITCH HAZEL-GLYCERIN EX PADS: 1.0000 "application " | MEDICATED_PAD | CUTANEOUS | Status: DC | PRN

## 2021-02-19 MED ORDER — ACETAMINOPHEN 325 MG PO TABS
650.0000 mg | ORAL_TABLET | ORAL | Status: DC | PRN
  Administered 2021-02-19 – 2021-02-20 (×3): 650 mg via ORAL
  Filled 2021-02-19 (×3): qty 2

## 2021-02-19 MED ORDER — METHYLERGONOVINE MALEATE 0.2 MG PO TABS: 0.2000 mg | ORAL_TABLET | ORAL | Status: DC | PRN

## 2021-02-19 MED ORDER — OXYCODONE-ACETAMINOPHEN 5-325 MG PO TABS: 2.0000 | ORAL_TABLET | ORAL | Status: DC | PRN

## 2021-02-19 MED ORDER — SENNOSIDES-DOCUSATE SODIUM 8.6-50 MG PO TABS
2.0000 | ORAL_TABLET | Freq: Every day | ORAL | Status: DC
  Administered 2021-02-20: 2 via ORAL
  Filled 2021-02-19: qty 2

## 2021-02-19 MED ORDER — IBUPROFEN 800 MG PO TABS
800.0000 mg | ORAL_TABLET | Freq: Three times a day (TID) | ORAL | Status: DC
  Administered 2021-02-19 – 2021-02-20 (×4): 800 mg via ORAL
  Filled 2021-02-19 (×4): qty 1

## 2021-02-19 MED ORDER — METHYLERGONOVINE MALEATE 0.2 MG/ML IJ SOLN: 0.2000 mg | INTRAMUSCULAR | Status: DC | PRN

## 2021-02-19 MED ORDER — ONDANSETRON HCL 4 MG PO TABS: 4.0000 mg | ORAL_TABLET | ORAL | Status: DC | PRN

## 2021-02-19 MED ORDER — DIPHENHYDRAMINE HCL 25 MG PO CAPS: 25.0000 mg | ORAL_CAPSULE | Freq: Four times a day (QID) | ORAL | Status: DC | PRN

## 2021-02-19 MED ORDER — FERROUS SULFATE 325 (65 FE) MG PO TABS
325.0000 mg | ORAL_TABLET | Freq: Two times a day (BID) | ORAL | Status: DC
  Administered 2021-02-19 – 2021-02-20 (×3): 325 mg via ORAL
  Filled 2021-02-19 (×3): qty 1

## 2021-02-19 MED ORDER — SIMETHICONE 80 MG PO CHEW: 80.0000 mg | CHEWABLE_TABLET | ORAL | Status: DC | PRN

## 2021-02-19 MED ORDER — MEASLES, MUMPS & RUBELLA VAC IJ SOLR: 0.5000 mL | Freq: Once | INTRAMUSCULAR | Status: DC

## 2021-02-19 MED ORDER — SODIUM CHLORIDE 0.9% FLUSH: 3.0000 mL | INTRAVENOUS | Status: DC | PRN

## 2021-02-19 MED ORDER — MAGNESIUM HYDROXIDE 400 MG/5ML PO SUSP: 30.0000 mL | ORAL | Status: DC | PRN

## 2021-02-19 MED ORDER — SODIUM CHLORIDE 0.9% FLUSH: 3.0000 mL | Freq: Two times a day (BID) | INTRAVENOUS | Status: DC

## 2021-02-19 MED ORDER — DIBUCAINE (PERIANAL) 1 % EX OINT: 1.0000 "application " | TOPICAL_OINTMENT | CUTANEOUS | Status: DC | PRN

## 2021-02-19 MED ORDER — PRENATAL MULTIVITAMIN CH
1.0000 | ORAL_TABLET | Freq: Every day | ORAL | Status: DC
  Administered 2021-02-19: 1 via ORAL
  Filled 2021-02-19: qty 1

## 2021-02-19 MED ORDER — COCONUT OIL OIL: 1.0000 "application " | TOPICAL_OIL | Status: DC | PRN

## 2021-02-19 MED ORDER — TETANUS-DIPHTH-ACELL PERTUSSIS 5-2.5-18.5 LF-MCG/0.5 IM SUSY: 0.5000 mL | PREFILLED_SYRINGE | Freq: Once | INTRAMUSCULAR | Status: DC

## 2021-02-19 MED ORDER — BENZOCAINE-MENTHOL 20-0.5 % EX AERO
1.0000 "application " | INHALATION_SPRAY | CUTANEOUS | Status: DC | PRN
  Administered 2021-02-19: 1 via TOPICAL
  Filled 2021-02-19: qty 56

## 2021-02-19 MED ORDER — ZOLPIDEM TARTRATE 5 MG PO TABS: 5.0000 mg | ORAL_TABLET | Freq: Every evening | ORAL | Status: DC | PRN

## 2021-02-19 MED ORDER — SODIUM CHLORIDE 0.9 % IV SOLN: 250.0000 mL | INTRAVENOUS | Status: DC | PRN

## 2021-02-19 MED ORDER — ONDANSETRON HCL 4 MG/2ML IJ SOLN: 4.0000 mg | INTRAMUSCULAR | Status: DC | PRN

## 2021-02-19 NOTE — Plan of Care (Signed)
  Problem: Education: Goal: Knowledge of condition will improve Outcome: Progressing   Problem: Activity: Goal: Will verbalize the importance of balancing activity with adequate rest periods Outcome: Progressing Goal: Ability to tolerate increased activity will improve Outcome: Progressing   Problem: Coping: Goal: Ability to identify and utilize available resources and services will improve Outcome: Progressing   

## 2021-02-19 NOTE — Lactation Note (Signed)
This note was copied from a baby's chart. Lactation Consultation Note  Patient Name: Boy Reannon Candella [id number] Date: 02/19/2021 Reason for consult: L&D Initial assessment Age:39 hours LC entered the room, infant was cuing to breastfeed, mom latched infant on her left breast using the football hold position. Initially infant was on and off the  breast but after 5 minutes infant started sustaining latch, infant was still breastfeeding after 14 minutes when LC left the room.  Mom knows to breastfeed infant according to feeding cues, 8 to 12+ or more times within 24 hours, skin to skin. Mom knows to ask RN/LC on MBU for latch assistance if needed.  Maternal Data Has patient been taught Hand Expression?: No Does the patient have breastfeeding experience prior to this delivery?: Yes How long did the patient breastfeed?: Per mom, she breastfeed and pumped for 1 year with her 1st and 2nd child  Feeding Mother's Current Feeding Choice: Breast Milk  LATCH Score Latch: Repeated attempts needed to sustain latch, nipple held in mouth throughout feeding, stimulation needed to elicit sucking reflex. (Infant was on and off breast initally but after 5 minutes infant started sustaining the latch.)  Audible Swallowing: A few with stimulation  Type of Nipple: Everted at rest and after stimulation  Comfort (Breast/Nipple): Soft / non-tender  Hold (Positioning): Assistance needed to correctly position infant at breast and maintain latch.  LATCH Score: 7   Lactation Tools Discussed/Used    Interventions Interventions: Assisted with latch;Skin to skin;Breast compression;Adjust position;Support pillows;Position options;Expressed milk;Education  Discharge Pump: Personal WIC Program: No  Consult Status      Danelle Earthly 02/19/2021, 1:20 AM

## 2021-02-19 NOTE — Anesthesia Postprocedure Evaluation (Signed)
Anesthesia Post Note  Patient: Mahlon Gammon Gilbertson  Procedure(s) Performed: AN AD HOC LABOR EPIDURAL     Patient location during evaluation: Mother Baby Anesthesia Type: Epidural Level of consciousness: awake and alert Pain management: pain level controlled Vital Signs Assessment: post-procedure vital signs reviewed and stable Respiratory status: spontaneous breathing, nonlabored ventilation and respiratory function stable Cardiovascular status: stable Postop Assessment: no headache, no backache and epidural receding Anesthetic complications: no   No notable events documented.  Last Vitals:  Vitals:   02/19/21 0330 02/19/21 0819  BP: (!) 99/57 (!) 93/56  Pulse: 80 70  Resp: 17 20  Temp: 36.8 C 36.8 C  SpO2: 97%     Last Pain:  Vitals:   02/19/21 0330  TempSrc: Oral  PainSc: 0-No pain   Pain Goal:                   Mauricia Area

## 2021-02-19 NOTE — Progress Notes (Signed)
Post Partum Day 0 Subjective: no complaints, up ad lib, voiding, and tolerating PO  Objective: Blood pressure (!) 98/56, pulse 65, temperature 98.3 F (36.8 C), resp. rate 18, height 5\' 2"  (1.575 m), weight 81.6 kg, SpO2 97 %.  Physical Exam:  General: alert, cooperative, and appears stated age Lochia: appropriate Uterine Fundus: firm DVT Evaluation: No evidence of DVT seen on physical exam.  Recent Labs    02/18/21 1412 02/19/21 0508  HGB 13.1 11.4*  HCT 38.8 34.0*    Assessment/Plan: Plan for discharge tomorrow and Breastfeeding Declines circ   LOS: 1 day   02/21/21 02/19/2021, 12:52 PM

## 2021-02-19 NOTE — Lactation Note (Signed)
This note was copied from Jessica baby's chart. Lactation Consultation Note  Patient Name: Jessica Foley [id number] Date: 02/19/2021 Reason for consult: Initial assessment Age:38 hours  Initial visit to 16 hours old infant of Jessica P3 mother. Baby is latched upon arrival. Mother states latch is painful. LC assisted with latch and chin tug. Mother reports improvement and comfort. Mother is experienced. LC adjusted position and alignment.  Discussed normal newborn behavior and patterns, signs of good milk transfer, hunger cues, tummy size and benefits of skin to skin.   Plan: 1-Deep, comfortable latch and breastfeeding on demand or 8-12 times in 24h period. 2-Encouraged maternal rest, hydration and food intake.  3-Contact LC as needed for feeds/support/concerns/questions   All questions answered at this time. Provided Lactation services brochure and promoted [facility] booklet information.     Maternal Data Has patient been taught Hand Expression?: Yes Does the patient have breastfeeding experience prior to this delivery?: Yes How long did the patient breastfeed?: 12 months x2 (first child mostly pump and bottlefed expressed breast milk)  LATCH Score Latch: Grasps breast easily, tongue down, lips flanged, rhythmical sucking.  Audible Swallowing: Spontaneous and intermittent  Type of Nipple: Everted at rest and after stimulation  Comfort (Breast/Nipple): Soft / non-tender  Hold (Positioning): Assistance needed to correctly position infant at breast and maintain latch.  LATCH Score: 9  Interventions Interventions: Breast feeding basics reviewed;Assisted with latch;Skin to skin;Hand express;Expressed milk;Education;Adjust position;Breast compression;LC Services brochure  Discharge Pump: Personal WIC Program: No  Consult Status Consult Status: Follow-up Date: 02/20/21 Follow-up type: In-patient    Jessica Foley Jessica Foley 02/19/2021, 5:06 PM

## 2021-02-20 ENCOUNTER — Other Ambulatory Visit (HOSPITAL_COMMUNITY): Payer: Self-pay

## 2021-02-20 MED ORDER — IBUPROFEN 600 MG PO TABS
600.0000 mg | ORAL_TABLET | Freq: Four times a day (QID) | ORAL | 0 refills | Status: AC | PRN
  Filled 2021-02-20: qty 90, 23d supply, fill #0

## 2021-02-20 NOTE — Discharge Summary (Signed)
Postpartum Discharge Summary      Patient Name: Jessica Foley DOB: 1980/09/05 MRN: [id number]  Date of admission: 02/18/2021 Delivery date:02/19/2021  Delivering provider: Carrington Clamp  Date of discharge: 02/20/2021  Admitting diagnosis: Pregnant and not yet delivered [Z34.90] Intrauterine pregnancy: [redacted]w[redacted]d     Secondary diagnosis:  Active Problems:   Pregnant and not yet delivered  Additional problems: Advanced maternal age    Discharge diagnosis: Term Pregnancy Delivered                                              Post partum procedures: NA Augmentation: AROM and Pitocin Complications: None  Hospital course: Onset of Labor With Vaginal Delivery      40 y.o. yo G3P2002 at [redacted]w[redacted]d was admitted for induction of labor for advanced maternal age on 02/18/2021. Patient had an uncomplicated labor course as follows:  Membrane Rupture Time/Date: 6:42 PM ,02/18/2021   Delivery Method:Vaginal, Spontaneous  Episiotomy: None  Lacerations:  2nd degree  Patient had an uncomplicated postpartum course.  She is ambulating, tolerating a regular diet, passing flatus, and urinating well. Patient is discharged home in stable condition on 02/20/21.  Newborn Data: Birth date:02/19/2021  Birth time:12:11 AM  Gender:Female  Living status:Living  Apgars:8 ,9  Weight:3487 g   Magnesium Sulfate received: No BMZ received: No Rhophylac:N/A  Physical exam  Vitals:   02/19/21 1153 02/19/21 1753 02/19/21 2115 02/20/21 0536  BP: (!) 98/56 (!) 81/69 115/73 (!) 109/53  Pulse: 65 66 73 72  Resp: 18 20 17 16   Temp: 98.3 F (36.8 C) 98 F (36.7 C) 98 F (36.7 C) 98 F (36.7 C)  TempSrc:   Oral Oral  SpO2:   100% 99%  Weight:      Height:       General: alert, cooperative, and no distress Lochia: appropriate Uterine Fundus: firm DVT Evaluation: No evidence of DVT seen on physical exam. Labs: Lab Results  Component Value Date   WBC 14.7 (H) 02/19/2021   HGB 11.4 (L) 02/19/2021    HCT 34.0 (L) 02/19/2021   MCV 87.2 02/19/2021   PLT 152 02/19/2021   No flowsheet data found. Edinburgh Score: Edinburgh Postnatal Depression Scale Screening Tool 02/19/2021  I have been able to laugh and see the funny side of things. 0  I have looked forward with enjoyment to things. 0  I have blamed myself unnecessarily when things went wrong. 0  I have been anxious or worried for no good reason. 0  I have felt scared or panicky for no good reason. 0  Things have been getting on top of me. 0  I have been so unhappy that I have had difficulty sleeping. 0  I have felt sad or miserable. 0  I have been so unhappy that I have been crying. 0  The thought of harming myself has occurred to me. 0  Edinburgh Postnatal Depression Scale Total 0      After visit meds:  Allergies as of 02/20/2021   No Known Allergies      Medication List     TAKE these medications    ibuprofen 600 MG tablet Commonly known as: ADVIL Take 1 tablet (600 mg total) by mouth every 6 (six) hours as needed.   prenatal multivitamin Tabs tablet Take 1 tablet by mouth daily at 12 noon.  Discharge Care Instructions  (From admission, onward)           Start     Ordered   02/20/21 0000  Discharge wound care:       Comments: For a cesarean delivery: You may wash incision with soap and water.  Do not soak or submerge the incision for 2 weeks. Keep incision dry. You may need to keep a sanitary pad or panty liner between the incision and your clothing for comfort and to keep the incision dry. If you note drainage, increased pain, or increased redness of the incision, then please notify your physician.   02/20/21 1127   02/20/21 0000  If the dressing is still on your incision site when you go home, remove it on the third day after your surgery date. Remove dressing if it begins to fall off, or if it is dirty or damaged before the third day.       Comments: For a cesarean delivery    02/20/21 1127             Discharge home in stable condition Infant Feeding: Breast Infant Disposition:home with mother Discharge instruction: per After Visit Summary and Postpartum booklet. Activity: Advance as tolerated. Pelvic rest for 6 weeks.  Diet: routine diet Anticipated Birth Control: Unsure Postpartum Appointment:4 weeks Future Appointments: Future Appointments  Date Time Provider Department Center  03/22/2021  3:15 PM Ellamae Sia, DO [facility] None   Follow up Visit:  Follow-up Information     Carrington Clamp, MD Follow up.   Specialty: Obstetrics and Gynecology Contact information: [address] [place] [address] [phone]                     02/20/2021 Waynard Reeds, MD

## 2021-02-21 ENCOUNTER — Other Ambulatory Visit (HOSPITAL_COMMUNITY): Payer: Self-pay

## 2021-03-01 ENCOUNTER — Other Ambulatory Visit (HOSPITAL_COMMUNITY): Payer: Self-pay

## 2021-03-04 ENCOUNTER — Telehealth (HOSPITAL_COMMUNITY): Payer: Self-pay | Admitting: *Deleted

## 2021-03-04 NOTE — Telephone Encounter (Signed)
Left message to return nurse call.  Duffy Rhody, RN 03-04-2021 at 10:47am

## 2021-03-22 ENCOUNTER — Ambulatory Visit (INDEPENDENT_AMBULATORY_CARE_PROVIDER_SITE_OTHER): Payer: No Typology Code available for payment source | Admitting: Allergy

## 2021-03-22 ENCOUNTER — Other Ambulatory Visit: Payer: Self-pay

## 2021-03-22 ENCOUNTER — Encounter: Payer: Self-pay | Admitting: Allergy

## 2021-03-22 VITALS — BP 116/80 | HR 57 | Temp 97.8°F | Resp 16 | Ht 62.0 in | Wt 163.0 lb

## 2021-03-22 DIAGNOSIS — J3089 Other allergic rhinitis: Secondary | ICD-10-CM | POA: Diagnosis not present

## 2021-03-22 DIAGNOSIS — H1013 Acute atopic conjunctivitis, bilateral: Secondary | ICD-10-CM | POA: Diagnosis not present

## 2021-03-22 DIAGNOSIS — H1010 Acute atopic conjunctivitis, unspecified eye: Secondary | ICD-10-CM | POA: Insufficient documentation

## 2021-03-22 DIAGNOSIS — J302 Other seasonal allergic rhinitis: Secondary | ICD-10-CM | POA: Diagnosis not present

## 2021-03-22 NOTE — Patient Instructions (Addendum)
Allergies:  At the end of February: Start Singulair (montelukast) 10mg  daily at night. Cautioned that in some children/adults can experience behavioral changes including hyperactivity, agitation, depression, sleep disturbances and suicidal ideations. These side effects are rare, but if you notice them you should notify me and discontinue Singulair (montelukast). Use Rhinocort or Flonase or Nasacort nasal spray 1-2 sprays per nostril twice a day as needed for nasal congestion.  Use azelastine nasal spray 1-2 sprays per nostril twice a day as needed for runny nose/drainage.  Nasal saline spray (i.e., Simply Saline) or nasal saline lavage (i.e., NeilMed) is recommended as needed and prior to medicated nasal sprays. Use over the counter antihistamines such as Zyrtec (cetirizine), Claritin (loratadine), Allegra (fexofenadine), or Xyzal (levocetirizine) daily as needed. May take twice a day during allergy flares. May switch antihistamines every few months. See below for environmental control measures.   Consider allergy injections for long term control if above medications do not help the symptoms.   Follow up in 4 months or sooner if needed.   Reducing Pollen Exposure Pollen seasons: trees (spring), grass (summer) and ragweed/weeds (fall). Keep windows closed in your home and car to lower pollen exposure.  Install air conditioning in the bedroom and throughout the house if possible.  Avoid going out in dry windy days - especially early morning. Pollen counts are highest between 5 - 10 AM and on dry, hot and windy days.  Save outside activities for late afternoon or after a heavy rain, when pollen levels are lower.  Avoid mowing of grass if you have grass pollen allergy. Be aware that pollen can also be transported indoors on people and pets.  Dry your clothes in an automatic dryer rather than hanging them outside where they might collect pollen.  Rinse hair and eyes before bedtime.  Pet  Allergen Avoidance: Contrary to popular opinion, there are no "hypoallergenic" breeds of dogs or cats. That is because people are not allergic to an animal's hair, but to an allergen found in the animal's saliva, dander (dead skin flakes) or urine. Pet allergy symptoms typically occur within minutes. For some people, symptoms can build up and become most severe 8 to 12 hours after contact with the animal. People with severe allergies can experience reactions in public places if dander has been transported on the pet owners' clothing. Keeping an animal outdoors is only a partial solution, since homes with pets in the yard still have higher concentrations of animal allergens. Before getting a pet, ask your allergist to determine if you are allergic to animals. If your pet is already considered part of your family, try to minimize contact and keep the pet out of the bedroom and other rooms where you spend a great deal of time. As with dust mites, vacuum carpets often or replace carpet with a hardwood floor, tile or linoleum. High-efficiency particulate air (HEPA) cleaners can reduce allergen levels over time. While dander and saliva are the source of cat and dog allergens, urine is the source of allergens from rabbits, hamsters, mice and [id number] pigs; so ask a non-allergic family member to clean the animal's cage. If you have a pet allergy, talk to your allergist about the potential for allergy immunotherapy (allergy shots). This strategy can often provide long-term relief.

## 2021-03-22 NOTE — Assessment & Plan Note (Signed)
Past history - Rhinoconjunctivitis symptoms mainly in the spring since moved to [place] 8 months ago.  Tried Zyrtec, Sudafed, Afrin and Flonase with minimal benefit.  No prior ENT evaluation. Interim history - 2022 blood work positive to dust mites, cat, dog, tree pollen. Borderline positive to grass pollen, cockroach and ragweed pollen. Had baby and breastfeeding. Asymptomatic since last visit with no meds.  At the end of February:  Start Singulair (montelukast) 10mg  daily at night.  Cautioned that in some children/adults can experience behavioral changes including hyperactivity, agitation, depression, sleep disturbances and suicidal ideations. These side effects are rare, but if you notice them you should notify me and discontinue Singulair (montelukast).  Use Rhinocort or Flonase or Nasacort nasal spray 1-2 sprays per nostril twice a day as needed for nasal congestion.   Use azelastine nasal spray 1-2 sprays per nostril twice a day as needed for runny nose/drainage.  Nasal saline spray (i.e., Simply Saline) or nasal saline lavage (i.e., NeilMed) is recommended as needed and prior to medicated nasal sprays. . Use over the counter antihistamines such as Zyrtec (cetirizine), Claritin (loratadine), Allegra (fexofenadine), or Xyzal (levocetirizine) daily as needed. May take twice a day during allergy flares. May switch antihistamines every few months.  See below for environmental control measures.   Consider allergy injections for long term control if above medications do not help the symptoms.

## 2021-03-22 NOTE — Progress Notes (Signed)
Follow Up Note  RE: Jessica Foley MRN: [id number] DOB: 09-Jul-1980 Date of Office Visit: 03/22/2021  Referring provider: No ref. provider found Primary care provider: Pcp, No  Chief Complaint: Allergic Rhinitis  (Spring is the worse time for her no issues today )  History of Present Illness: I had the pleasure of seeing Jessica Foley for a follow up visit at the [facility] on 03/22/2021. She is a 40 y.o. female, who is being followed for allergic rhinoconjunctivitis. Her previous allergy office visit was on 09/13/2020 with Dr. Selena Batten. Today is a regular follow up visit.  Allergic rhino conjunctivitis Patient just had a baby and is currently breastfeeding. No symptoms since May. Currently not taking any allergy medications. Symptoms flared this year from the end of February through May.   Wants to see how this season goes before committing to allergy injections.  Patient has all these meds at home and does not need any refills.   Assessment and Plan: Jessica Foley is a 40 y.o. female with: Seasonal and perennial allergic rhinoconjunctivitis Past history - Rhinoconjunctivitis symptoms mainly in the spring since moved to [place] 8 months ago.  Tried Zyrtec, Sudafed, Afrin and Flonase with minimal benefit.  No prior ENT evaluation. Interim history - 2022 blood work positive to dust mites, cat, dog, tree pollen. Borderline positive to grass pollen, cockroach and ragweed pollen. Had baby and breastfeeding. Asymptomatic since last visit with no meds. At the end of February: Start Singulair (montelukast) 10mg  daily at night. Cautioned that in some children/adults can experience behavioral changes including hyperactivity, agitation, depression, sleep disturbances and suicidal ideations. These side effects are rare, but if you notice them you should notify me and discontinue Singulair (montelukast). Use Rhinocort or Flonase or Nasacort nasal spray 1-2 sprays per nostril twice a  day as needed for nasal congestion.  Use azelastine nasal spray 1-2 sprays per nostril twice a day as needed for runny nose/drainage. Nasal saline spray (i.e., Simply Saline) or nasal saline lavage (i.e., NeilMed) is recommended as needed and prior to medicated nasal sprays. Use over the counter antihistamines such as Zyrtec (cetirizine), Claritin (loratadine), Allegra (fexofenadine), or Xyzal (levocetirizine) daily as needed. May take twice a day during allergy flares. May switch antihistamines every few months. See below for environmental control measures.  Consider allergy injections for long term control if above medications do not help the symptoms.   Return in about 4 months (around 07/20/2021).  No orders of the defined types were placed in this encounter.  Lab Orders  No laboratory test(s) ordered today    Diagnostics: None.  Medication List:  Current Outpatient Medications  Medication Sig Dispense Refill   ibuprofen (ADVIL) 600 MG tablet Take 1 tablet (600 mg total) by mouth every 6 (six) hours as needed. 90 tablet 0   Prenatal Vit-Fe Fumarate-FA (PRENATAL MULTIVITAMIN) TABS tablet Take 1 tablet by mouth daily at 12 noon.     No current facility-administered medications for this visit.   Allergies: No Known Allergies I reviewed her past medical history, social history, family history, and environmental history and no significant changes have been reported from her previous visit.  Review of Systems  Constitutional:  Negative for appetite change, chills, fever and unexpected weight change.  HENT:  Negative for congestion, postnasal drip, rhinorrhea and sneezing.   Eyes:  Negative for itching.  Respiratory:  Negative for cough, chest tightness, shortness of breath and wheezing.   Cardiovascular:  Negative for chest pain.  Gastrointestinal:  Negative for abdominal pain.  Genitourinary:  Negative for difficulty urinating.  Skin:  Negative for rash.  Allergic/Immunologic:  Positive for environmental allergies.   Objective: BP 116/80   Pulse (!) 57   Temp 97.8 F (36.6 C)   Resp 16   Ht 5\' 2"  (1.575 m)   Wt 163 lb (73.9 kg)   LMP  (LMP Unknown)   SpO2 98%   BMI 29.81 kg/m  Body mass index is 29.81 kg/m. Physical Exam Vitals and nursing note reviewed.  Constitutional:      Appearance: Normal appearance. She is well-developed.  HENT:     Head: Normocephalic and atraumatic.     Right Ear: Tympanic membrane and external ear normal.     Left Ear: Tympanic membrane and external ear normal.     Nose: Nose normal.     Mouth/Throat:     Mouth: Mucous membranes are moist.     Pharynx: Oropharynx is clear.  Eyes:     Conjunctiva/sclera: Conjunctivae normal.  Cardiovascular:     Rate and Rhythm: Normal rate and regular rhythm.     Heart sounds: Normal heart sounds. No murmur heard.   No friction rub. No gallop.  Pulmonary:     Effort: Pulmonary effort is normal.     Breath sounds: Normal breath sounds. No wheezing, rhonchi or rales.  Musculoskeletal:     Cervical back: Neck supple.  Skin:    General: Skin is warm.     Findings: No rash.  Neurological:     Mental Status: She is alert and oriented to person, place, and time.  Psychiatric:        Behavior: Behavior normal.   Previous notes and tests were reviewed. The plan was reviewed with the patient/family, and all questions/concerned were addressed.  It was my pleasure to see Jessica Foley today and participate in her care. Please feel free to contact me with any questions or concerns.  Sincerely,  Lamarr Lulas, DO Allergy & Immunology  [facility] office: [phone] [facility] office: [phone]

## 2021-05-25 ENCOUNTER — Other Ambulatory Visit (HOSPITAL_COMMUNITY): Payer: Self-pay

## 2021-05-25 MED ORDER — DICLOXACILLIN SODIUM 500 MG PO CAPS
ORAL_CAPSULE | ORAL | 0 refills | Status: AC
  Filled 2021-05-25: qty 28, 7d supply, fill #0

## 2021-05-26 ENCOUNTER — Other Ambulatory Visit (HOSPITAL_COMMUNITY): Payer: Self-pay

## 2021-07-20 ENCOUNTER — Ambulatory Visit: Payer: No Typology Code available for payment source | Admitting: Allergy

## 2021-07-20 DIAGNOSIS — J309 Allergic rhinitis, unspecified: Secondary | ICD-10-CM

## 2021-07-20 NOTE — Progress Notes (Deleted)
? ?Follow Up Note ? ?RE: Jessica Foley MRN: [id number] DOB: 1981-01-30 ?Date of Office Visit: 07/20/2021 ? ?Referring provider: No ref. provider found ?Primary care provider: Pcp, No ? ?Chief Complaint: No chief complaint on file. ? ?History of Present Illness: ?I had the pleasure of seeing Jessica Foley for a follow up visit at the [facility] on 07/20/2021. She is a 41 y.o. female, who is being followed for allergic rhinoconjunctivitis. Her previous allergy office visit was on 03/22/2021 with Dr. Selena Batten. Today is a regular follow up visit. ? ?Seasonal and perennial allergic rhinoconjunctivitis ?Past history - Rhinoconjunctivitis symptoms mainly in the spring since moved to [place] 8 months ago.  Tried Zyrtec, Sudafed, Afrin and Flonase with minimal benefit.  No prior ENT evaluation. ?Interim history - 2022 blood work positive to dust mites, cat, dog, tree pollen. Borderline positive to grass pollen, cockroach and ragweed pollen. Had baby and breastfeeding. Asymptomatic since last visit with no meds. ?At the end of February: ?Start Singulair (montelukast) 10mg  daily at night. ?Cautioned that in some children/adults can experience behavioral changes including hyperactivity, agitation, depression, sleep disturbances and suicidal ideations. These side effects are rare, but if you notice them you should notify me and discontinue Singulair (montelukast). ?Use Rhinocort or Flonase or Nasacort nasal spray 1-2 sprays per nostril twice a day as needed for nasal congestion.  ?Use azelastine nasal spray 1-2 sprays per nostril twice a day as needed for runny nose/drainage. ?Nasal saline spray (i.e., Simply Saline) or nasal saline lavage (i.e., NeilMed) is recommended as needed and prior to medicated nasal sprays. ?Use over the counter antihistamines such as Zyrtec (cetirizine), Claritin (loratadine), Allegra (fexofenadine), or Xyzal (levocetirizine) daily as needed. May take twice a day during allergy  flares. May switch antihistamines every few months. ?See below for environmental control measures.  ?Consider allergy injections for long term control if above medications do not help the symptoms.  ?  ?Return in about 4 months (around 07/20/2021). ? ?Assessment and Plan: ?[name] is a 41 y.o. female with: ?No problem-specific Assessment & Plan notes found for this encounter. ? ?No follow-ups on file. ? ?No orders of the defined types were placed in this encounter. ? ?Lab Orders  ?No laboratory test(s) ordered today  ? ? ?Diagnostics: ?Spirometry:  ?Tracings reviewed. Her effort: {Blank single:19197::"Good reproducible efforts.","It was hard to get consistent efforts and there is a question as to whether this reflects a maximal maneuver.","Poor effort, data can not be interpreted."} ?FVC: ***L ?FEV1: ***L, ***% predicted ?FEV1/FVC ratio: ***% ?Interpretation: {Blank single:19197::"Spirometry consistent with mild obstructive disease","Spirometry consistent with moderate obstructive disease","Spirometry consistent with severe obstructive disease","Spirometry consistent with possible restrictive disease","Spirometry consistent with mixed obstructive and restrictive disease","Spirometry uninterpretable due to technique","Spirometry consistent with normal pattern","No overt abnormalities noted given today's efforts"}.  ?Please see scanned spirometry results for details. ? ?Skin Testing: {Blank single:19197::"Select foods","Environmental allergy panel","Environmental allergy panel and select foods","Food allergy panel","None","Deferred due to recent antihistamines use"}. ?*** ?Results discussed with patient/family. ? ? ?Medication List:  ?Current Outpatient Medications  ?Medication Sig Dispense Refill  ?? dicloxacillin (DYNAPEN) 500 MG capsule Take 1 capsule by mouth every 6 hours for 7 days. 28 capsule 0  ?? ibuprofen (ADVIL) 600 MG tablet Take 1 tablet (600 mg total) by mouth every 6 (six) hours as needed. 90 tablet 0  ??  Prenatal Vit-Fe Fumarate-FA (PRENATAL MULTIVITAMIN) TABS tablet Take 1 tablet by mouth daily at 12 noon.    ? ?No current facility-administered medications for  this visit.  ? ?Allergies: ?No Known Allergies ?I reviewed her past medical history, social history, family history, and environmental history and no significant changes have been reported from her previous visit. ? ?Review of Systems  ?Constitutional:  Negative for appetite change, chills, fever and unexpected weight change.  ?HENT:  Negative for congestion, postnasal drip, rhinorrhea and sneezing.   ?Eyes:  Negative for itching.  ?Respiratory:  Negative for cough, chest tightness, shortness of breath and wheezing.   ?Cardiovascular:  Negative for chest pain.  ?Gastrointestinal:  Negative for abdominal pain.  ?Genitourinary:  Negative for difficulty urinating.  ?Skin:  Negative for rash.  ?Allergic/Immunologic: Positive for environmental allergies.  ? ?Objective: ?There were no vitals taken for this visit. ?There is no height or weight on file to calculate BMI. ?Physical Exam ?Vitals and nursing note reviewed.  ?Constitutional:   ?   Appearance: Normal appearance. She is well-developed.  ?HENT:  ?   Head: Normocephalic and atraumatic.  ?   Right Ear: Tympanic membrane and external ear normal.  ?   Left Ear: Tympanic membrane and external ear normal.  ?   Nose: Nose normal.  ?   Mouth/Throat:  ?   Mouth: Mucous membranes are moist.  ?   Pharynx: Oropharynx is clear.  ?Eyes:  ?   Conjunctiva/sclera: Conjunctivae normal.  ?Cardiovascular:  ?   Rate and Rhythm: Normal rate and regular rhythm.  ?   Heart sounds: Normal heart sounds. No murmur heard. ?  No friction rub. No gallop.  ?Pulmonary:  ?   Effort: Pulmonary effort is normal.  ?   Breath sounds: Normal breath sounds. No wheezing, rhonchi or rales.  ?Musculoskeletal:  ?   Cervical back: Neck supple.  ?Skin: ?   General: Skin is warm.  ?   Findings: No rash.  ?Neurological:  ?   Mental Status: She is alert  and oriented to person, place, and time.  ?Psychiatric:     ?   Behavior: Behavior normal.  ?Previous notes and tests were reviewed. ?The plan was reviewed with the patient/family, and all questions/concerned were addressed. ? ?It was my pleasure to see Hae today and participate in her care. Please feel free to contact me with any questions or concerns. ? ?Sincerely, ? ?Wyline Mood, DO ?Allergy & Immunology ? ?[facility] ?[place] office: [phone] ?[place] office: [phone] ?

## 2021-07-27 ENCOUNTER — Other Ambulatory Visit (HOSPITAL_COMMUNITY): Payer: Self-pay

## 2021-08-18 IMAGING — US US MFM OB DETAIL+14 WK
1 series · 13 of 28 positions shown · non-contrast
Comparison: none

[Series 1: us mfm ob detail+14 wk · 13 of 120 slices shown]
[im 5/120]
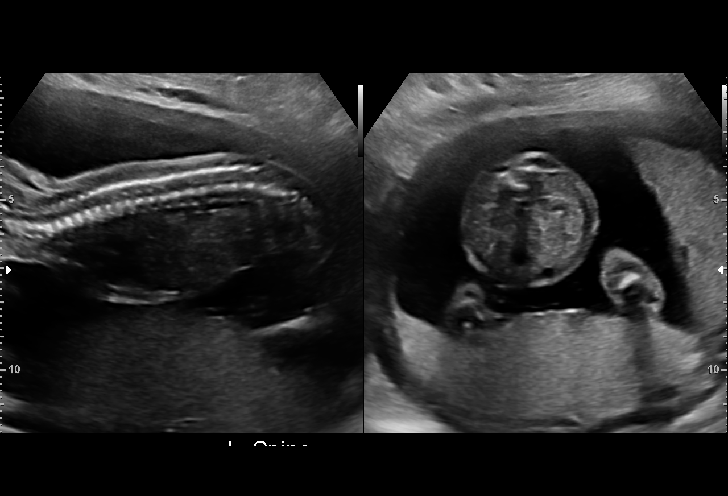
[im 14/120]
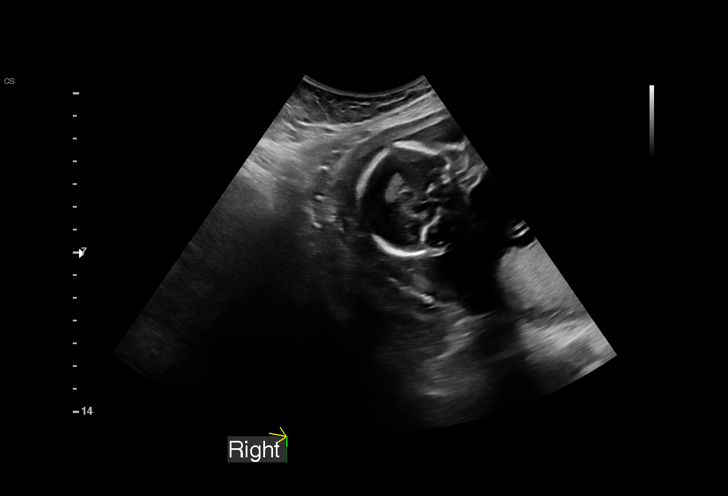
[im 23/120]
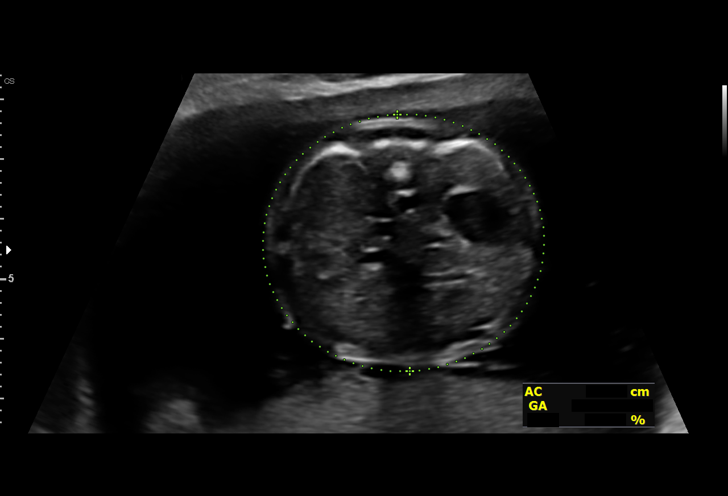
[im 31/120]
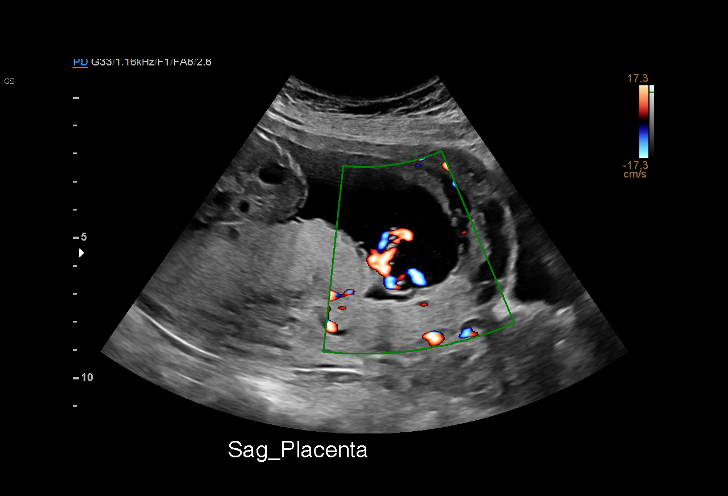
[im 40/120]
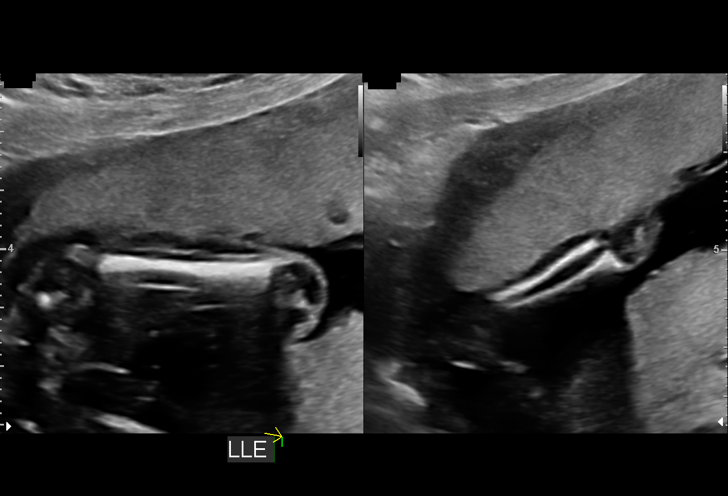
[im 49/120]
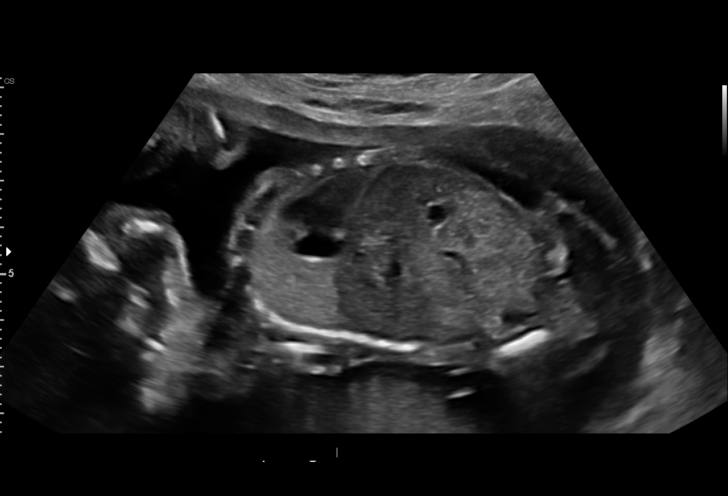
[im 62/120]
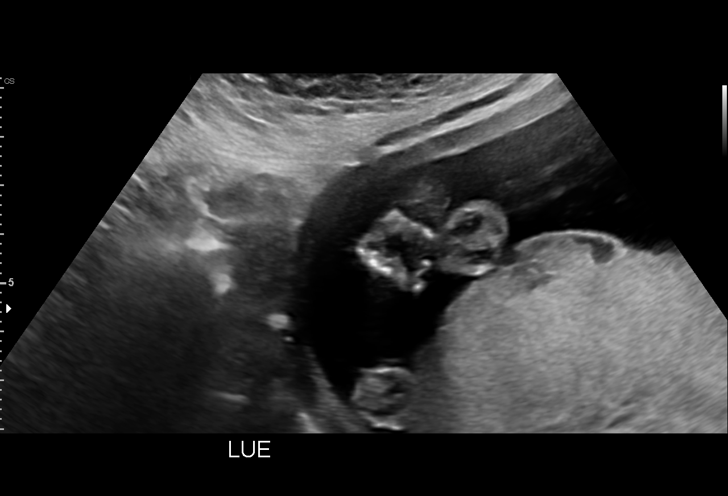
[im 71/120]
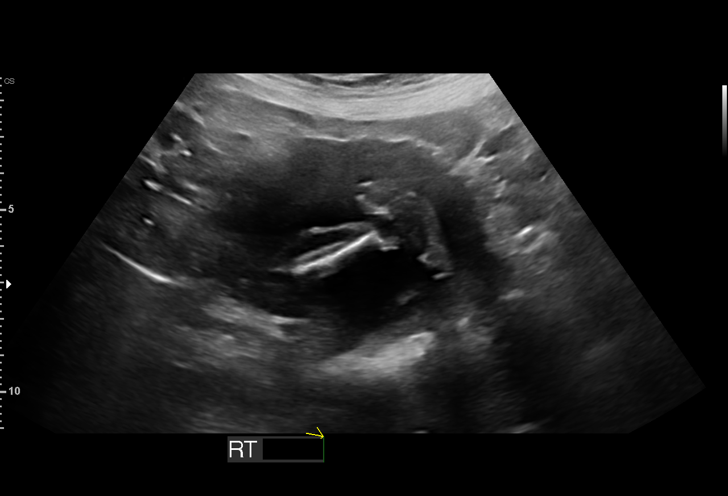
[im 80/120]
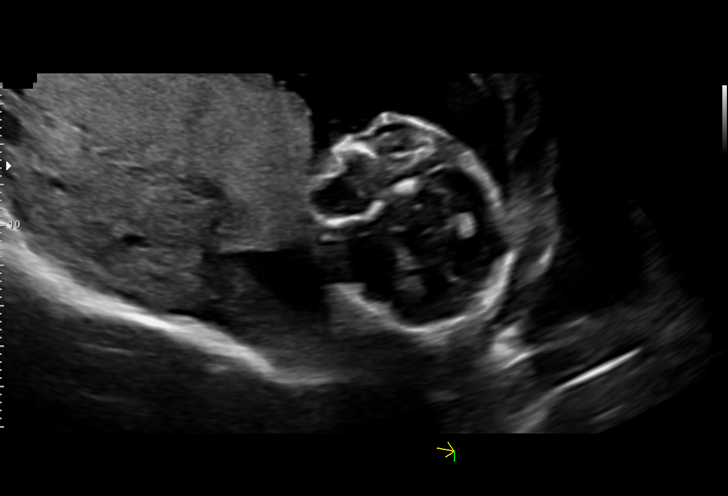
[im 89/120]
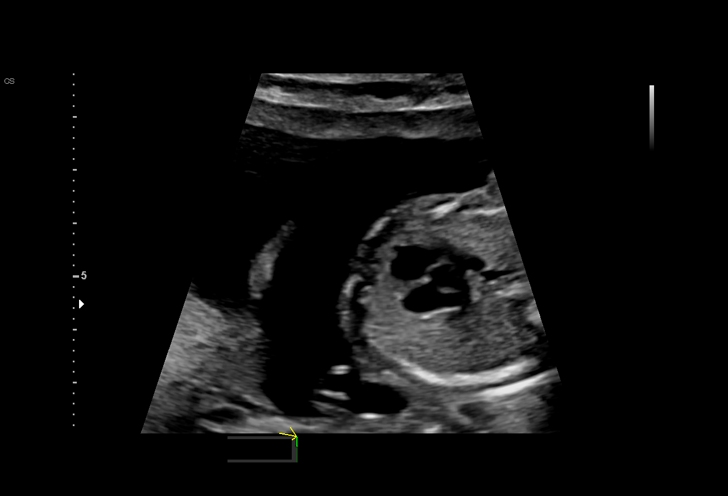
[im 97/120]
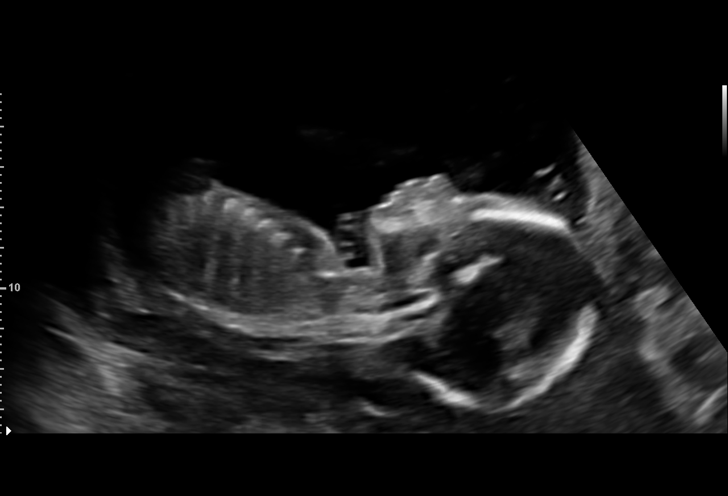
[im 106/120]
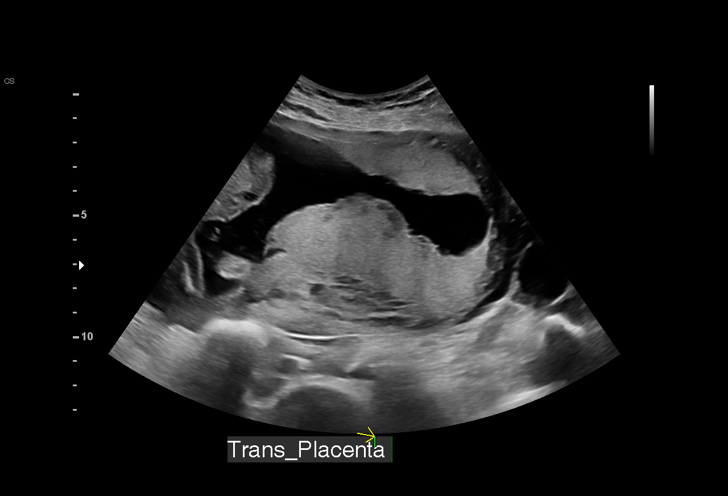
[im 115/120]
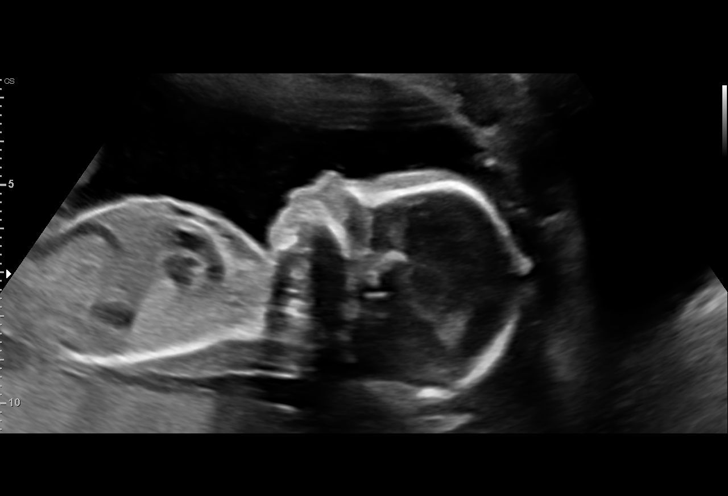

[13 of 28 positions shown; findings below may reference images not displayed]

OBGYN

Indications

 19 weeks gestation of pregnancy
 Antenatal screening for malformations
 Advanced maternal age multigravida 35+,
 second trimester
 Low risk NIPS
Fetal Evaluation

 Num Of Fetuses:         1
 Fetal Heart Rate(bpm):  150
 Cardiac Activity:       Observed
 Presentation:           Variable
 Placenta:               Posterior w. ant succenturiate lobe?
 P. Cord Insertion:      Not well visualized

 Amniotic Fluid
 AFI FV:      Within normal limits

                             Largest Pocket(cm)

Biometry
 BPD:      44.4  mm     G. Age:  19w 3d         69  %    CI:        76.61   %    70 - 86
                                                         FL/HC:      18.4   %    16.1 -
 HC:      160.7  mm     G. Age:  18w 6d         37  %    HC/AC:      1.15        1.09 -
 AC:      140.2  mm     G. Age:  19w 3d         60  %    FL/BPD:     66.4   %
 FL:       29.5  mm     G. Age:  19w 1d         47  %    FL/AC:      21.0   %    20 - 24
 HUM:      27.4  mm     G. Age:  18w 5d         43  %
 CER:      19.5  mm     G. Age:  19w 0d         33  %
 NFT:       3.8  mm

 LV:          6  mm
 CM:        4.6  mm

 Est. FW:     281  gm    0 lb 10 oz      60  %
OB History

 Gravidity:    3         Term:   2        Prem:   0        SAB:   0
 TOP:          0       Ectopic:  0        Living: 2
Gestational Age

 U/S Today:     19w 2d                                        EDD:   02/21/21
 Best:          19w 0d     Det. By:  Early Ultrasound         EDD:   02/23/21
                                     (07/12/20)
Anatomy

 Cranium:               Appears normal         LVOT:                   Appears normal
 Cavum:                 Appears normal         Aortic Arch:            Appears normal
 Ventricles:            Appears normal         Ductal Arch:            Appears normal
 Choroid Plexus:        Appears normal         Diaphragm:              Appears normal
 Cerebellum:            Appears normal         Stomach:                Appears normal, left
                                                                       sided
 Posterior Fossa:       Appears normal         Abdomen:                Appears normal
 Nuchal Fold:           Appears normal         Abdominal Wall:         Appears nml (cord
                                                                       insert, abd wall)
 Face:                  Appears normal         Cord Vessels:           Appears normal (3
                        (orbits and profile)                           vessel cord)
 Lips:                  Appears normal         Kidneys:                Appear normal
 Palate:                Appears normal         Bladder:                Appears normal
 Thoracic:              Appears normal         Spine:                  Appears normal
 Heart:                 Appears normal         Upper Extremities:      Appears normal
                        (4CH, axis, and
                        situs)
 RVOT:                  Appears normal         Lower Extremities:      Appears normal

 Other:  Fetus appears to be a male. Heels/feet and open hands/5th digits
         visualized. VC, 3VV and 3VTV visualized. Lenses visualized. Nasal
         bone visualized.
Cervix Uterus Adnexa

 Cervix
 Length:            3.4  cm.
 Normal appearance by transabdominal scan.
 Uterus
 No abnormality visualized.

 Right Ovary
 Not visualized.

 Left Ovary
 Within normal limits.

 Cul De Sac
 No free fluid seen.

 Adnexa
 No abnormality visualized.
Impression

 G3 P2.  Patient is here for fetal anatomy scan.  Advanced
 maternal age.  On cell free fetal DNA screening, the risks of
 fetal aneuploidies are not increased.
 Obstetric history significant for 2 term deliveries.
 We performed a fetal anatomy scan. No markers of
 aneuploidies or fetal structural defects are seen. Fetal
 biometry is consistent with her previously-established dates.
 Amniotic fluid is normal and good fetal activity is seen.
 Is a possibility of succenturiate lobe.  However, single
 placenta was seen on maternal left lateral side. Placental
 cord insertion could not be clearly identified in relation to the
 placenta. No fetal vessels are seen overlying the internal os
 (no evidence of vasa previa).
 I explained the finding and the possibility of succenturiate
 lobe that needs to be confirmed on follow-up scans.
Recommendations

 -An appointment was made for her to return in 4 weeks for
 placental evaluation (including cord to rule out velamentous
 insertion). Transvaginal ultrasound as needed.
                 Madsen, Dimitar

## 2022-03-14 ENCOUNTER — Other Ambulatory Visit: Payer: Self-pay | Admitting: Hematology and Oncology

## 2022-03-14 ENCOUNTER — Other Ambulatory Visit (HOSPITAL_COMMUNITY): Payer: Self-pay

## 2022-03-14 MED ORDER — SULFAMETHOXAZOLE-TRIMETHOPRIM 800-160 MG PO TABS
1.0000 | ORAL_TABLET | Freq: Two times a day (BID) | ORAL | 1 refills | Status: AC
  Filled 2022-03-14: qty 14, 7d supply, fill #0

## 2022-03-14 NOTE — Progress Notes (Signed)
Sent a prescription for Bactrim for complaints of urinary infection.

## 2022-08-04 DIAGNOSIS — Z862 Personal history of diseases of the blood and blood-forming organs and certain disorders involving the immune mechanism: Secondary | ICD-10-CM | POA: Diagnosis not present

## 2022-09-01 ENCOUNTER — Other Ambulatory Visit (HOSPITAL_COMMUNITY): Payer: Self-pay

## 2022-09-01 ENCOUNTER — Other Ambulatory Visit: Payer: Self-pay | Admitting: Hematology and Oncology

## 2022-09-01 MED ORDER — AMOXICILLIN-POT CLAVULANATE 875-125 MG PO TABS
1.0000 | ORAL_TABLET | Freq: Two times a day (BID) | ORAL | 0 refills | Status: AC
  Filled 2022-09-01: qty 14, 7d supply, fill #0

## 2022-09-12 ENCOUNTER — Other Ambulatory Visit (HOSPITAL_COMMUNITY): Payer: Self-pay

## 2023-02-21 NOTE — Progress Notes (Unsigned)
[facility] [address] [place] [address] Phone: [phone] Subjective:   INadine Counts, am serving as a scribe for Dr. Antoine Primas.  I'm seeing this patient by the request  of:  Pcp, No  CC: knee pain   [id number]  Jessica Foley is a 42 y.o. female coming in with complaint of knee pain. Patient states L knee is swelling on and off for a couple of years. Doing a lot of activity causes the swelling. Gets better on its on. Usually takes a little while to do down.      Past Medical History:  Diagnosis Date   Anemia    Hypothyroidism 2008   subclinical hypothyroidism during pregnancy   No past surgical history on file. Social History   Socioeconomic History   Marital status: Married    Spouse name: Not on file   Number of children: Not on file   Years of education: Not on file   Highest education level: Not on file  Occupational History   Not on file  Tobacco Use   Smoking status: Never   Smokeless tobacco: Never  Vaping Use   Vaping status: Never Used  Substance and Sexual Activity   Alcohol use: Never   Drug use: Never   Sexual activity: Yes  Other Topics Concern   Not on file  Social History Narrative   ** Merged History Encounter **       Social Determinants of Health   Financial Resource Strain: Not on file  Food Insecurity: Not on file  Transportation Needs: Not on file  Physical Activity: Not on file  Stress: Not on file  Social Connections: Not on file   No Known Allergies Family History  Problem Relation Age of Onset   Allergic rhinitis Brother        Current Outpatient Medications (Analgesics):    ibuprofen (ADVIL) 600 MG tablet, Take 1 tablet (600 mg total) by mouth every 6 (six) hours as needed.   Current Outpatient Medications (Other):    amoxicillin-clavulanate (AUGMENTIN) 875-125 MG tablet, Take 1 tablet by mouth 2 (two) times daily.   sulfamethoxazole-trimethoprim (BACTRIM DS)  800-160 MG tablet, Take 1 tablet by mouth 2 (two) times daily.   Vitamin D, Ergocalciferol, (DRISDOL) 1.25 MG (50000 UNIT) CAPS capsule, Take 1 capsule (50,000 Units total) by mouth every 7 (seven) days.   dicloxacillin (DYNAPEN) 500 MG capsule, Take 1 capsule by mouth every 6 hours for 7 days.   Prenatal Vit-Fe Fumarate-FA (PRENATAL MULTIVITAMIN) TABS tablet, Take 1 tablet by mouth daily at 12 noon.   Reviewed prior external information including notes and imaging from  primary care provider As well as notes that were available from care everywhere and other healthcare systems.  Past medical history, social, surgical and family history all reviewed in electronic medical record.  No pertanent information unless stated regarding to the chief complaint.   Review of Systems:  No headache, visual changes, nausea, vomiting, diarrhea, constipation, dizziness, abdominal pain, skin rash, fevers, chills, night sweats, weight loss, swollen lymph nodes, body aches, joint swelling, chest pain, shortness of breath, mood changes. POSITIVE muscle aches  Objective  Blood pressure 118/74, pulse 68, height 5\' 2"  (1.575 m), weight 135 lb (61.2 kg), SpO2 99%, unknown if currently breastfeeding.   General: No apparent distress alert and oriented x3 mood and affect normal, dressed appropriately.  HEENT: Pupils equal, extraocular movements intact  Respiratory: Patient's speak in full sentences and does not  appear short of breath  Cardiovascular: No lower extremity edema, non tender, no erythema  Left knee is tender to palpation over the medial joint line.  Patient does not have any significant instability noted.  Patient has full range of motion noted.  Limited muscular skeletal ultrasound was performed and interpreted by Antoine Primas, M   Limited ultrasound does show that there is some thickening of the cortex of the tibia proximally.  There is some periosteal hypoechoic changes consistent with a stress  reaction.  No true cortical defect noted. Impression: Tibial stress reaction    Impression and Recommendations:     The above documentation has been reviewed and is accurate and complete Judi Saa, DO

## 2023-02-22 ENCOUNTER — Other Ambulatory Visit (HOSPITAL_COMMUNITY): Payer: Self-pay

## 2023-02-22 ENCOUNTER — Encounter: Payer: Self-pay | Admitting: Family Medicine

## 2023-02-22 ENCOUNTER — Ambulatory Visit: Payer: No Typology Code available for payment source | Admitting: Family Medicine

## 2023-02-22 ENCOUNTER — Other Ambulatory Visit: Payer: Self-pay

## 2023-02-22 VITALS — BP 118/74 | HR 68 | Ht 62.0 in | Wt 135.0 lb

## 2023-02-22 DIAGNOSIS — G8929 Other chronic pain: Secondary | ICD-10-CM | POA: Diagnosis not present

## 2023-02-22 DIAGNOSIS — M898X6 Other specified disorders of bone, lower leg: Secondary | ICD-10-CM | POA: Insufficient documentation

## 2023-02-22 DIAGNOSIS — M25562 Pain in left knee: Secondary | ICD-10-CM

## 2023-02-22 MED ORDER — VITAMIN D (ERGOCALCIFEROL) 1.25 MG (50000 UNIT) PO CAPS
50000.0000 [IU] | ORAL_CAPSULE | ORAL | 0 refills | Status: AC
  Filled 2023-02-22 – 2023-03-13 (×2): qty 12, 84d supply, fill #0

## 2023-02-22 NOTE — Assessment & Plan Note (Signed)
Tibial pain that does seem to be more of a stress reaction.  Discussed with patient about once weekly vitamin D, and decreasing high impact exercises.  Discussed icing regimen and home exercises, do not think any shoe changes would be necessary at the moment.  Follow-up again in 6 to 8 weeks otherwise.

## 2023-02-22 NOTE — Patient Instructions (Addendum)
Once weekly Vit D K2 daily for 1 month Do prescribed exercises at least 3x a week See you again in 5-6 weeks if needed

## 2023-03-09 ENCOUNTER — Other Ambulatory Visit (HOSPITAL_COMMUNITY): Payer: Self-pay

## 2023-03-13 ENCOUNTER — Other Ambulatory Visit (HOSPITAL_COMMUNITY): Payer: Self-pay

## 2023-03-20 ENCOUNTER — Other Ambulatory Visit: Payer: Self-pay

## 2023-03-20 DIAGNOSIS — M25562 Pain in left knee: Secondary | ICD-10-CM

## 2023-04-09 ENCOUNTER — Other Ambulatory Visit: Payer: Commercial Managed Care - PPO

## 2023-04-11 ENCOUNTER — Other Ambulatory Visit: Payer: Commercial Managed Care - PPO

## 2023-04-12 ENCOUNTER — Other Ambulatory Visit: Payer: Commercial Managed Care - PPO

## 2023-04-23 ENCOUNTER — Other Ambulatory Visit: Payer: Commercial Managed Care - PPO

## 2023-07-13 ENCOUNTER — Other Ambulatory Visit: Payer: Self-pay | Admitting: Hematology and Oncology

## 2023-07-13 ENCOUNTER — Other Ambulatory Visit (HOSPITAL_COMMUNITY): Payer: Self-pay

## 2023-07-13 MED ORDER — CIPROFLOXACIN-DEXAMETHASONE 0.3-0.1 % OT SUSP
4.0000 [drp] | Freq: Two times a day (BID) | OTIC | 0 refills | Status: AC
  Filled 2023-07-13: qty 7.5, 19d supply, fill #0

## 2023-07-17 DIAGNOSIS — H6122 Impacted cerumen, left ear: Secondary | ICD-10-CM | POA: Diagnosis not present

## 2023-08-13 ENCOUNTER — Other Ambulatory Visit (HOSPITAL_COMMUNITY): Payer: Self-pay

## 2023-08-13 MED ORDER — NORGESTIMATE-ETH ESTRADIOL 0.25-35 MG-MCG PO TABS
1.0000 | ORAL_TABLET | Freq: Every day | ORAL | 0 refills | Status: AC
  Filled 2023-08-13: qty 56, 42d supply, fill #0
  Filled 2023-08-23: qty 56, 56d supply, fill #0

## 2023-08-16 ENCOUNTER — Encounter: Payer: Self-pay | Admitting: Family Medicine

## 2023-08-20 ENCOUNTER — Ambulatory Visit
Admission: RE | Admit: 2023-08-20 | Discharge: 2023-08-20 | Disposition: A | Discharge status: Home / Self Care | Source: Ambulatory Visit | Attending: Family Medicine | Admitting: Family Medicine

## 2023-08-20 DIAGNOSIS — M25562 Pain in left knee: Secondary | ICD-10-CM

## 2023-08-20 DIAGNOSIS — R600 Localized edema: Secondary | ICD-10-CM | POA: Diagnosis not present

## 2023-08-20 DIAGNOSIS — G8929 Other chronic pain: Secondary | ICD-10-CM | POA: Diagnosis not present

## 2023-08-21 ENCOUNTER — Encounter: Payer: Self-pay | Admitting: Family Medicine

## 2023-08-22 ENCOUNTER — Other Ambulatory Visit (HOSPITAL_COMMUNITY): Payer: Self-pay

## 2023-08-23 ENCOUNTER — Other Ambulatory Visit: Payer: Self-pay | Admitting: Adult Health

## 2023-08-23 ENCOUNTER — Other Ambulatory Visit (HOSPITAL_COMMUNITY): Payer: Self-pay

## 2023-08-23 MED ORDER — ATOVAQUONE-PROGUANIL HCL 250-100 MG PO TABS
1.0000 | ORAL_TABLET | Freq: Every day | ORAL | 0 refills | Status: AC
  Filled 2023-08-23: qty 90, 90d supply, fill #0

## 2023-08-23 NOTE — Progress Notes (Signed)
 Traveling to [place].  Sent in Malaria prophylaxis per cdc recommendations.  Alwin Baars, NP 08/23/23 1:35 PM Medical Oncology and Hematology [facility] [address] [place], [place] [address] Tel. [phone]    Fax. [phone]

## 2023-08-24 ENCOUNTER — Other Ambulatory Visit (HOSPITAL_COMMUNITY): Payer: Self-pay

## 2024-05-02 ENCOUNTER — Other Ambulatory Visit: Payer: Self-pay | Admitting: Hematology and Oncology

## 2024-05-02 DIAGNOSIS — D509 Iron deficiency anemia, unspecified: Secondary | ICD-10-CM | POA: Insufficient documentation

## 2024-05-02 DIAGNOSIS — D508 Other iron deficiency anemias: Secondary | ICD-10-CM

## 2024-05-13 ENCOUNTER — Inpatient Hospital Stay

## 2024-05-16 ENCOUNTER — Inpatient Hospital Stay: Attending: Hematology and Oncology

## 2024-05-16 DIAGNOSIS — D508 Other iron deficiency anemias: Secondary | ICD-10-CM

## 2024-05-16 MED ORDER — IRON SUCROSE 20 MG/ML IV SOLN
200.0000 mg | Freq: Once | INTRAVENOUS | Status: AC
  Administered 2024-05-16: 200 mg via INTRAVENOUS
  Filled 2024-05-16: qty 10

## 2024-05-16 MED ORDER — ACETAMINOPHEN 325 MG PO TABS: 325.0000 mg | ORAL_TABLET | Freq: Once | ORAL | Status: DC

## 2024-05-16 MED ORDER — SODIUM CHLORIDE 0.9 % IV SOLN: INTRAVENOUS | Status: DC

## 2024-05-16 MED ORDER — LORATADINE 10 MG PO TABS: 10.0000 mg | ORAL_TABLET | Freq: Once | ORAL | Status: DC

## 2024-05-16 NOTE — Patient Instructions (Signed)

## 2024-05-16 NOTE — Progress Notes (Signed)
 Declined to stay for post 30-min observation period. Ambulated out of clinic without incident

## 2024-05-16 NOTE — Progress Notes (Signed)
 Per Dr. Loretha, her Hgb = 11.2 & TIBC >500 She has had PICA as well. Per Dr. Lonn, labs from outside facility.  Not yet scanned into EMR.  Gabrielly Mccrystal, Pharm.D., CPP 05/16/2024@4 :30 PM
